# Patient Record
Sex: Male | Born: 1995 | Race: Black or African American | Hispanic: No | Marital: Single | Smoking: Never smoker
Health system: Southern US, Community
[De-identification: ages and names within clinical notes are randomized; demographics above are authoritative.]

## PROBLEM LIST (undated history)

## (undated) DIAGNOSIS — N289 Disorder of kidney and ureter, unspecified: Secondary | ICD-10-CM

## (undated) DIAGNOSIS — I10 Essential (primary) hypertension: Secondary | ICD-10-CM

## (undated) NOTE — *Deleted (*Deleted)
Pharmacy Student Note: For Learning Purposes ONLY. Not an active part of the chart.   Chief Complaint:  [id number] developed HA and blurry vision 11/23, then double vision  11/25. Seen by ophtalmologist who referred to ED due to papilledema. Pt had not taken BP meds for 2-3 weeks leading up to admit.   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   PSH:   Recommendations:  Renal plans to begin process for HD vasc placement, but no dialysis right now (no uremic sxs)  Clevidipine > 72HRs and TG elev (178) >> Titrate up orals to get off clevi drip (labetalol too)  PTH, VitD lvl ordered > f/u 11/30  Working up renin, aldo, catecholamines, metanephrines  Admit Complaint:  Anticoagulation: hep -pt mobile per crit care note  Infectious Disease  Cardiovascular: Hypertensive emergency sec to noncompliance; BP initially 175/133 (highest 256/172)  Hrs 100s (113) -elev troponins (type II demand) -Bp trend 11/28-11/29: 148/95 > 189/97 > 138/77 > BP goal now 140-150s per CCM MD (RN note) 11/30 BPS 155, 141 -current meds: hydralazine 100 mg PO Q8HR (max 300), amlodipine PO 10 mg, clevidipine IV infusion 0.5 mg/mL titrate w/ new goal of 140s-150s (previously MAP 112 w/ 25% reduction for first 24 hours) >> clevidipine drip x 72 hours >> dose continued to be titrated from 9>>11 yest   Electrolytes Hypokalemia 3.4 > monitor K Na 133 (possibly sec to free water retention d/t AKI) Met ac w/ bicarb decr > monitor (d/t AoCKD) CO2 19   Endocrinology TSH WNL Gastrointestinal / Nutrition  Neurology 11/27 CT Head - no acute findings; -head CT without findings 11/28 MRI - BP effects - diffuse abnormality in brainstem and cerebellum concerning for PRES   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

---

## 2017-04-21 ENCOUNTER — Emergency Department (HOSPITAL_COMMUNITY): Payer: 59

## 2017-04-21 ENCOUNTER — Observation Stay (HOSPITAL_COMMUNITY)
Admission: EM | Admit: 2017-04-21 | Discharge: 2017-04-22 | Disposition: A | Discharge status: Home / Self Care | Payer: 59 | Attending: Internal Medicine | Admitting: Internal Medicine

## 2017-04-21 ENCOUNTER — Encounter (HOSPITAL_COMMUNITY): Payer: Self-pay | Admitting: Emergency Medicine

## 2017-04-21 ENCOUNTER — Other Ambulatory Visit: Payer: Self-pay

## 2017-04-21 DIAGNOSIS — R9431 Abnormal electrocardiogram [ECG] [EKG]: Secondary | ICD-10-CM | POA: Diagnosis present

## 2017-04-21 DIAGNOSIS — I4581 Long QT syndrome: Secondary | ICD-10-CM | POA: Insufficient documentation

## 2017-04-21 DIAGNOSIS — I10 Essential (primary) hypertension: Secondary | ICD-10-CM | POA: Diagnosis present

## 2017-04-21 DIAGNOSIS — Z79899 Other long term (current) drug therapy: Secondary | ICD-10-CM | POA: Diagnosis not present

## 2017-04-21 DIAGNOSIS — Z9114 Patient's other noncompliance with medication regimen: Secondary | ICD-10-CM | POA: Diagnosis not present

## 2017-04-21 DIAGNOSIS — N189 Chronic kidney disease, unspecified: Secondary | ICD-10-CM | POA: Insufficient documentation

## 2017-04-21 DIAGNOSIS — N19 Unspecified kidney failure: Secondary | ICD-10-CM

## 2017-04-21 DIAGNOSIS — I169 Hypertensive crisis, unspecified: Principal | ICD-10-CM | POA: Diagnosis present

## 2017-04-21 DIAGNOSIS — I129 Hypertensive chronic kidney disease with stage 1 through stage 4 chronic kidney disease, or unspecified chronic kidney disease: Secondary | ICD-10-CM | POA: Insufficient documentation

## 2017-04-21 DIAGNOSIS — N289 Disorder of kidney and ureter, unspecified: Secondary | ICD-10-CM

## 2017-04-21 DIAGNOSIS — Z91018 Allergy to other foods: Secondary | ICD-10-CM | POA: Diagnosis not present

## 2017-04-21 LAB — I-STAT TROPONIN, ED: Troponin i, poc: 0.01 ng/mL (ref 0.00–0.08)

## 2017-04-21 LAB — CBC
HEMATOCRIT: 44.7 % (ref 39.0–52.0)
Hemoglobin: 15.6 g/dL (ref 13.0–17.0)
MCH: 31.8 pg (ref 26.0–34.0)
MCHC: 34.9 g/dL (ref 30.0–36.0)
MCV: 91.2 fL (ref 78.0–100.0)
Platelets: 295 10*3/uL (ref 150–400)
RBC: 4.9 MIL/uL (ref 4.22–5.81)
RDW: 12.6 % (ref 11.5–15.5)
WBC: 10.4 10*3/uL (ref 4.0–10.5)

## 2017-04-21 LAB — BASIC METABOLIC PANEL
Anion gap: 10 (ref 5–15)
BUN: 14 mg/dL (ref 6–20)
CHLORIDE: 101 mmol/L (ref 101–111)
CO2: 25 mmol/L (ref 22–32)
Calcium: 9.5 mg/dL (ref 8.9–10.3)
Creatinine, Ser: 3 mg/dL — ABNORMAL HIGH (ref 0.61–1.24)
GFR calc Af Amer: 33 mL/min — ABNORMAL LOW (ref >60)
GFR calc non Af Amer: 28 mL/min — ABNORMAL LOW (ref >60)
Glucose, Bld: 95 mg/dL (ref 65–99)
POTASSIUM: 3.9 mmol/L (ref 3.5–5.1)
SODIUM: 136 mmol/L (ref 135–145)

## 2017-04-21 MED ORDER — LABETALOL HCL 5 MG/ML IV SOLN
20.0000 mg | Freq: Once | INTRAVENOUS | Status: AC
  Administered 2017-04-21: 20 mg via INTRAVENOUS
  Filled 2017-04-21: qty 4

## 2017-04-21 NOTE — ED Provider Notes (Signed)
[facility] EMERGENCY DEPARTMENT Provider Note   CSN: [id number] Arrival date & time: 04/21/17  2044     History   Chief Complaint Chief Complaint  Patient presents with  . Hypertension  . Headache    HPI Robert Conner is a 22 y.o. male.  HPI  Robert Conner is a 22yo male with a history of hypertension who presents to the Emergency Department via urgent care for hypertension and headache.  Patient reports that he has had a mild headache for the past week, but it acutely worsened this morning.  Denies sudden onset/thunderclap headache or worst headache of his life.  He reports pain is bitemporal and feels "throbbing" in nature.  Reports that when he moves his head the pain travels to his posterior occiput.  He has not taken any over-the-counter medications for his symptoms.  Reports that he has had "trouble focusing," stating that he cannot work or complete ADLs due to the pain.  He states that he hasn't had much of an appetite today, but vomitied some water earlier. He denies trouble with his vision. Denies chest pain, shortness of breath, diaphoresis, lightheadedness, leg swelling, focal numbness or weakness, abdominal pain, diarrhea, dysuria, urinary frequency, difficulty urinating.   Patient states that two years ago he was seen by [facility] for "kidney problem." Is unsure of what he was diagnosed with at the time. States that he was hypertensive and had to be on lisinopril for a period of time but was told that he did not need to be on this anymore and has not taken it for about a year now.   History reviewed. No pertinent past medical history.  There are no active problems to display for this patient.   History reviewed. No pertinent surgical history.      Home Medications    Prior to Admission medications   Not on File    Family History No family history on file.  Social History Social History   Tobacco Use  . Smoking status: Never Smoker    . Smokeless tobacco: Never Used  Substance Use Topics  . Alcohol use: Yes    Comment: rarely  . Drug use: Never     Allergies   Other   Review of Systems Review of Systems  Constitutional: Negative for chills and fever.  HENT: Negative for trouble swallowing.   Respiratory: Negative for shortness of breath.   Cardiovascular: Negative for chest pain, palpitations and leg swelling.  Gastrointestinal: Positive for vomiting. Negative for abdominal pain.  Genitourinary: Negative for difficulty urinating, dysuria, flank pain and frequency.  Musculoskeletal: Negative for gait problem, neck pain and neck stiffness.  Neurological: Positive for headaches. Negative for speech difficulty, weakness and numbness.  Psychiatric/Behavioral: Negative for agitation and confusion.     Physical Exam Updated Vital Signs BP (!) 246/164   Pulse 89   Temp 99.1 F (37.3 C) (Oral)   Resp 18   Ht 5\' 8"  (1.727 m)   Wt 100.2 kg (221 lb)   SpO2 98%   BMI 33.60 kg/m   Physical Exam  Constitutional: He is oriented to person, place, and time. He appears well-developed and well-nourished. No distress.  Sitting at bedside in no apparent distress.   HENT:  Head: Normocephalic and atraumatic.  Mouth/Throat: Oropharynx is clear and moist. No oropharyngeal exudate.  Eyes: Pupils are equal, round, and reactive to light. Conjunctivae are normal. Right eye exhibits no discharge. Left eye exhibits no discharge.  Neck: Normal range  of motion. Neck supple. No JVD present. No tracheal deviation present.  Cardiovascular: Normal rate, regular rhythm and intact distal pulses. Exam reveals no friction rub.  No murmur heard. Pulmonary/Chest: Effort normal and breath sounds normal. No stridor. No respiratory distress. He has no wheezes.  Abdominal: Soft. Bowel sounds are normal. There is no tenderness. There is no guarding.  Musculoskeletal:  No leg swelling  Neurological: He is alert and oriented to person,  place, and time. Coordination normal.  Mental Status:  Alert, oriented, thought content appropriate, able to give a coherent history. Speech fluent without evidence of aphasia. Able to follow 2 step commands without difficulty.  Cranial Nerves:  II:  Peripheral visual fields grossly normal, pupils equal, round, reactive to light III,IV, VI: ptosis not present, extra-ocular motions intact bilaterally  V,VII: smile symmetric, facial light touch sensation equal VIII: hearing grossly normal to voice  X: uvula elevates symmetrically  XI: bilateral shoulder shrug symmetric and strong XII: midline tongue extension without fassiculations Motor:  Normal tone. 5/5 in upper and lower extremities bilaterally including strong and equal grip strength and dorsiflexion/plantar flexion Sensory: Pinprick and light touch normal in all extremities.  Cerebellar: normal finger-to-nose with bilateral upper extremities Gait: normal gait and balance  Skin: Skin is warm and dry. He is not diaphoretic.  Psychiatric: He has a normal mood and affect. His behavior is normal.  Nursing note and vitals reviewed.    ED Treatments / Results  Labs (all labs ordered are listed, but only abnormal results are displayed) Labs Reviewed  BASIC METABOLIC PANEL - Abnormal; Notable for the following components:      Result Value   Creatinine, Ser 3.00 (*)    GFR calc non Af Amer 28 (*)    GFR calc Af Amer 33 (*)    All other components within normal limits  CBC  URINALYSIS, ROUTINE W REFLEX MICROSCOPIC  I-STAT TROPONIN, ED    EKG EKG Interpretation  Date/Time:  Friday April 21 2017 21:15:33 EDT Ventricular Rate:  97 PR Interval:  146 QRS Duration: 82 QT Interval:  370 QTC Calculation: 469 R Axis:   51 Text Interpretation:  Normal sinus rhythm T wave abnormality, consider inferior ischemia Prolonged QT Abnormal ECG No old tracing to compare Confirmed by Duffy Bruce [phone]) on 04/21/2017 9:24:56  PM   Radiology Dg Chest 2 View  Result Date: 04/21/2017 CLINICAL DATA:  Hypertensive emergency with irregular EKG. Headache without current chest pain. EXAM: CHEST - 2 VIEW COMPARISON:  None. FINDINGS: The heart size and mediastinal contours are within normal limits. Both lungs are clear. The visualized skeletal structures are unremarkable. IMPRESSION: No active cardiopulmonary disease. Electronically Signed   By: Ashley Royalty M.D.   On: 04/21/2017 21:37   Ct Head Wo Contrast  Result Date: 04/21/2017 CLINICAL DATA:  Acute headache. EXAM: CT HEAD WITHOUT CONTRAST TECHNIQUE: Contiguous axial images were obtained from the base of the skull through the vertex without intravenous contrast. COMPARISON:  None. FINDINGS: Brain: No intracranial hemorrhage, mass effect, or midline shift. No hydrocephalus. The basilar cisterns are patent. No evidence of territorial infarct or acute ischemia. No extra-axial or intracranial fluid collection. Vascular: No hyperdense vessel or unexpected calcification. Skull: No fracture or focal lesion. Sinuses/Orbits: Small fluid level in right side of sphenoid sinus. Minimal ethmoid mucosal thickening. Mastoid air cells are clear. Visualized orbits are normal. Other: None. IMPRESSION: 1. Normal intracranial. 2. Small fluid level in right side of sphenoid sinus and mild mucosal thickening of the  ethmoid air cells. Electronically Signed   By: Jeb Levering M.D.   On: 04/21/2017 23:42    Procedures Procedures (including critical care time)  Medications Ordered in ED Medications  labetalol (NORMODYNE,TRANDATE) injection 20 mg (20 mg Intravenous Given 04/21/17 2342)     Initial Impression / Assessment and Plan / ED Course  I have reviewed the triage vital signs and the nursing notes.  Pertinent labs & imaging results that were available during my care of the patient were reviewed by me and considered in my medical decision making (see chart for details).     Patient  presents with hypertension and headache.  He reports nonspecific history of kidney disease in the past for which he was seen by [facility] pediatric team. Unfortunately no record of this in the EMR. Patient has been on lisinopril in the past but is not currently taking any antihypertensives.  On exam patient is afebrile and non-toxic. Blood pressure 232/166 on arrival. No neurological deficits. Lungs CTA. No peripheral swelling.   Labs reviewed. BMP reveals elevated Creatinine of 3.00, no previous to compare to. EKG reveals t-wave inversions in inferior leads, no prior EKG to compare. Istat troponin negative. CXR without acute abnormality, no pulmonary edema. CT head without acute abnormality, no ICH.   Do not suspect acute SAH given no history of sudden onset worst headache. No concern for meningitis given no nuchal rigidity or fever.  Attempted to get outside records from [place], but unsuccessful. Discussed this patient with Dr. Tyrone Nine. Given T-wave inversions in inferior leads on EKG and elevated creatinine plan to have patient admitted for hypertensive urgency. IV labetalol given in ED.    Discussed this patient with Hospitalist Dr. Myna Hidalgo who will admit patient. Family informed.   Final Clinical Impressions(s) / ED Diagnoses   Final diagnoses:  None    ED Discharge Orders    None       Glyn Ade, PA-C 04/22/17 [place], [place], DO 04/22/17 [phone]

## 2017-04-21 NOTE — ED Notes (Signed)
Patient transported to CT 

## 2017-04-21 NOTE — ED Triage Notes (Addendum)
Pt was seen at urgent care and sent here for hypertensive emergency and irregular EKG.  Pt complaint of headache and "unable to focus" but no chest pain at this time. States his physician had him on lisinopril for a time but that was discontinued by his physician almost 2 years ago.

## 2017-04-22 ENCOUNTER — Encounter (HOSPITAL_COMMUNITY): Payer: Self-pay | Admitting: Family Medicine

## 2017-04-22 ENCOUNTER — Observation Stay (HOSPITAL_COMMUNITY): Payer: 59

## 2017-04-22 DIAGNOSIS — I169 Hypertensive crisis, unspecified: Secondary | ICD-10-CM | POA: Diagnosis not present

## 2017-04-22 DIAGNOSIS — R9431 Abnormal electrocardiogram [ECG] [EKG]: Secondary | ICD-10-CM | POA: Diagnosis not present

## 2017-04-22 DIAGNOSIS — N289 Disorder of kidney and ureter, unspecified: Secondary | ICD-10-CM | POA: Diagnosis not present

## 2017-04-22 LAB — HIV ANTIBODY (ROUTINE TESTING W REFLEX): HIV Screen 4th Generation wRfx: NONREACTIVE

## 2017-04-22 LAB — COMPREHENSIVE METABOLIC PANEL
ALK PHOS: 71 U/L (ref 38–126)
ALT: 17 U/L (ref 17–63)
ANION GAP: 10 (ref 5–15)
AST: 20 U/L (ref 15–41)
Albumin: 3.5 g/dL (ref 3.5–5.0)
BUN: 16 mg/dL (ref 6–20)
CO2: 25 mmol/L (ref 22–32)
CREATININE: 2.97 mg/dL — AB (ref 0.61–1.24)
Calcium: 9.1 mg/dL (ref 8.9–10.3)
Chloride: 101 mmol/L (ref 101–111)
GFR calc non Af Amer: 29 mL/min — ABNORMAL LOW (ref >60)
GFR, EST AFRICAN AMERICAN: 33 mL/min — AB (ref >60)
Glucose, Bld: 116 mg/dL — ABNORMAL HIGH (ref 65–99)
Potassium: 3.8 mmol/L (ref 3.5–5.1)
SODIUM: 136 mmol/L (ref 135–145)
TOTAL PROTEIN: 6.7 g/dL (ref 6.5–8.1)
Total Bilirubin: 0.9 mg/dL (ref 0.3–1.2)

## 2017-04-22 LAB — CBC WITH DIFFERENTIAL/PLATELET
BASOS ABS: 0 10*3/uL (ref 0.0–0.1)
Basophils Relative: 0 %
EOS ABS: 0 10*3/uL (ref 0.0–0.7)
Eosinophils Relative: 0 %
HCT: 41.5 % (ref 39.0–52.0)
HEMOGLOBIN: 14.4 g/dL (ref 13.0–17.0)
Lymphocytes Relative: 20 %
Lymphs Abs: 2.1 10*3/uL (ref 0.7–4.0)
MCH: 31.4 pg (ref 26.0–34.0)
MCHC: 34.7 g/dL (ref 30.0–36.0)
MCV: 90.6 fL (ref 78.0–100.0)
MONO ABS: 0.4 10*3/uL (ref 0.1–1.0)
MONOS PCT: 4 %
NEUTROS ABS: 7.7 10*3/uL (ref 1.7–7.7)
NEUTROS PCT: 76 %
Platelets: 268 10*3/uL (ref 150–400)
RBC: 4.58 MIL/uL (ref 4.22–5.81)
RDW: 12.4 % (ref 11.5–15.5)
WBC: 10.2 10*3/uL (ref 4.0–10.5)

## 2017-04-22 LAB — RAPID URINE DRUG SCREEN, HOSP PERFORMED
Amphetamines: NOT DETECTED
Barbiturates: NOT DETECTED
Benzodiazepines: NOT DETECTED
Cocaine: NOT DETECTED
OPIATES: NOT DETECTED
Tetrahydrocannabinol: NOT DETECTED

## 2017-04-22 LAB — URINALYSIS, ROUTINE W REFLEX MICROSCOPIC
BILIRUBIN URINE: NEGATIVE
Glucose, UA: NEGATIVE mg/dL
KETONES UR: NEGATIVE mg/dL
Leukocytes, UA: NEGATIVE
NITRITE: NEGATIVE
Specific Gravity, Urine: 1.005 (ref 1.005–1.030)
Squamous Epithelial / LPF: NONE SEEN
pH: 6 (ref 5.0–8.0)

## 2017-04-22 LAB — CREATININE, URINE, RANDOM: CREATININE, URINE: 83.13 mg/dL

## 2017-04-22 LAB — SODIUM, URINE, RANDOM: SODIUM UR: 27 mmol/L

## 2017-04-22 MED ORDER — HEPARIN SODIUM (PORCINE) 5000 UNIT/ML IJ SOLN
5000.0000 [IU] | Freq: Three times a day (TID) | INTRAMUSCULAR | Status: DC
  Administered 2017-04-22: 5000 [IU] via SUBCUTANEOUS
  Filled 2017-04-22: qty 1

## 2017-04-22 MED ORDER — CLONIDINE HCL 0.2 MG PO TABS: 0.2000 mg | ORAL_TABLET | Freq: Three times a day (TID) | ORAL | 0 refills | Status: DC

## 2017-04-22 MED ORDER — SODIUM CHLORIDE 0.9% FLUSH
3.0000 mL | Freq: Two times a day (BID) | INTRAVENOUS | Status: DC
  Administered 2017-04-22: 3 mL via INTRAVENOUS

## 2017-04-22 MED ORDER — SENNOSIDES-DOCUSATE SODIUM 8.6-50 MG PO TABS: 1.0000 | ORAL_TABLET | Freq: Every evening | ORAL | Status: DC | PRN

## 2017-04-22 MED ORDER — ONDANSETRON HCL 4 MG PO TABS: 4.0000 mg | ORAL_TABLET | Freq: Four times a day (QID) | ORAL | Status: DC | PRN

## 2017-04-22 MED ORDER — ACETAMINOPHEN 650 MG RE SUPP: 650.0000 mg | Freq: Four times a day (QID) | RECTAL | Status: DC | PRN

## 2017-04-22 MED ORDER — ONDANSETRON HCL 4 MG/2ML IJ SOLN: 4.0000 mg | Freq: Four times a day (QID) | INTRAMUSCULAR | Status: DC | PRN

## 2017-04-22 MED ORDER — HYDROCODONE-ACETAMINOPHEN 5-325 MG PO TABS: 1.0000 | ORAL_TABLET | ORAL | Status: DC | PRN

## 2017-04-22 MED ORDER — BISACODYL 5 MG PO TBEC: 5.0000 mg | DELAYED_RELEASE_TABLET | Freq: Every day | ORAL | Status: DC | PRN

## 2017-04-22 MED ORDER — ACETAMINOPHEN 325 MG PO TABS: 650.0000 mg | ORAL_TABLET | Freq: Four times a day (QID) | ORAL | Status: DC | PRN

## 2017-04-22 MED ORDER — SODIUM CHLORIDE 0.9 % IV SOLN: 250.0000 mL | INTRAVENOUS | Status: DC | PRN

## 2017-04-22 MED ORDER — LABETALOL HCL 5 MG/ML IV SOLN
20.0000 mg | INTRAVENOUS | Status: DC | PRN
  Administered 2017-04-22: 20 mg via INTRAVENOUS
  Filled 2017-04-22: qty 4

## 2017-04-22 MED ORDER — CLONIDINE HCL 0.2 MG PO TABS
0.2000 mg | ORAL_TABLET | Freq: Three times a day (TID) | ORAL | Status: DC
  Administered 2017-04-22 (×3): 0.2 mg via ORAL
  Filled 2017-04-22 (×3): qty 1

## 2017-04-22 MED ORDER — SODIUM CHLORIDE 0.9% FLUSH: 3.0000 mL | INTRAVENOUS | Status: DC | PRN

## 2017-04-22 NOTE — ED Notes (Signed)
Declined W/C at D/C and was escorted to lobby by RN. 

## 2017-04-22 NOTE — ED Notes (Signed)
Two gram sodium lunch tray ordered

## 2017-04-22 NOTE — Discharge Instructions (Signed)

## 2017-04-22 NOTE — H&P (Signed)
History and Physical    Robert Conner VHQ:[id number] DOB: [birth year] DOA: 04/21/2017  PCP: Patient, No Pcp Per   Patient coming from: Home  Chief Complaint: Headaches   HPI: Robert Conner is a 22 y.o. male with medical history significant for hypertension and kidney disease, now presenting to the emergency department for evaluation of headaches.  Patient reports that he has been having mild headaches for the past week, went to urgent care for evaluation of this, was noted to have severe hypertension and EKG abnormalities, and was directed to the ED for further evaluation.  Patient reports that he had previously been on lisinopril for high blood pressure and was told that he has kidney disease.  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  He stopped following with his PCP more than a year ago and has not been on any medications.  He has not checked his blood pressure.  He denies chest pain, palpitations, edema, sweats, change in vision or hearing, or focal numbness or weakness.  ED Course: Upon arrival to the ED, patient is found to be afebrile, saturating well on room air, hypertensive to 246/164.  EKG features a sinus rhythm with inferior T wave inversions and chest x-ray is negative for acute cardiopulmonary disease.  Noncontrast head CT is negative for acute intracranial abnormality.  Chemistry panel is notable for a creatinine of 3.00 and CBC is unremarkable.  Troponin is normal.  Patient was given 20 mg IV labetalol in the ED.  Be admitted to the stepdown unit for ongoing evaluation and management of hypertensive crisis.  Review of Systems:  All other systems reviewed and apart from HPI, are negative.  History reviewed. No pertinent past medical history.  History reviewed. No pertinent surgical history.   reports that he has never smoked. He has never used smokeless tobacco. He reports that he drinks alcohol. He reports that he does not use drugs.  Allergies  Allergen  Reactions  . Other Swelling    Tree nuts    Family History  Problem Relation Age of Onset  . Prostate cancer Maternal Grandfather   . Hypertension Maternal Grandfather   . Hypertension Paternal Grandmother      Prior to Admission medications   Not on File    Physical Exam: Vitals:   04/21/17 2200 04/21/17 2230 04/22/17 0000 04/22/17 0030  BP: (!) 231/158 (!) 229/152 (!) 246/164 (!) 214/138  Pulse: 96 94 89 99  Resp:      Temp:      TempSrc:      SpO2: 99% 99% 98% 97%  Weight:      Height:          Constitutional: NAD, calm  Eyes: PERTLA, lids and conjunctivae normal ENMT: Mucous membranes are moist. Posterior pharynx clear of any exudate or lesions.   Neck: normal, supple, no masses, no thyromegaly Respiratory: clear to auscultation bilaterally, no wheezing, no crackles. Normal respiratory effort.    Cardiovascular: S1 & S2 heard, regular rate and rhythm. No significant JVD. Abdomen: No distension, no tenderness, soft. Bowel sounds normal.  Musculoskeletal: no clubbing / cyanosis. No joint deformity upper and lower extremities.    Skin: no significant rashes, lesions, ulcers. Warm, dry, well-perfused. Neurologic: CN 2-12 grossly intact. Sensation intact. Strength 5/5 in all 4 limbs.  Psychiatric: Alert and oriented x 3. Calm, cooperative.     Labs on Admission: I have personally reviewed following labs and imaging studies  CBC: Recent Labs  Lab 04/21/17 2123  WBC 10.4  HGB 15.6  HCT 44.7  MCV 91.2  PLT 818   Basic Metabolic Panel: Recent Labs  Lab 04/21/17 2123  NA 136  K 3.9  CL 101  CO2 25  GLUCOSE 95  BUN 14  CREATININE 3.00*  CALCIUM 9.5   GFR: Estimated Creatinine Clearance: 44.7 mL/min (A) (by C-G formula based on SCr of 3 mg/dL (H)). Liver Function Tests: No results for input(s): AST, ALT, ALKPHOS, BILITOT, PROT, ALBUMIN in the last 168 hours. No results for input(s): LIPASE, AMYLASE in the last 168 hours. No results for input(s):  AMMONIA in the last 168 hours. Coagulation Profile: No results for input(s): INR, PROTIME in the last 168 hours. Cardiac Enzymes: No results for input(s): CKTOTAL, CKMB, CKMBINDEX, TROPONINI in the last 168 hours. BNP (last 3 results) No results for input(s): PROBNP in the last 8760 hours. HbA1C: No results for input(s): HGBA1C in the last 72 hours. CBG: No results for input(s): GLUCAP in the last 168 hours. Lipid Profile: No results for input(s): CHOL, HDL, LDLCALC, TRIG, CHOLHDL, LDLDIRECT in the last 72 hours. Thyroid Function Tests: No results for input(s): TSH, T4TOTAL, FREET4, T3FREE, THYROIDAB in the last 72 hours. Anemia Panel: No results for input(s): VITAMINB12, FOLATE, FERRITIN, TIBC, IRON, RETICCTPCT in the last 72 hours. Urine analysis:    Component Value Date/Time   COLORURINE STRAW (A) 04/22/2017 0106   APPEARANCEUR CLEAR 04/22/2017 0106   LABSPEC 1.005 04/22/2017 0106   PHURINE 6.0 04/22/2017 0106   GLUCOSEU NEGATIVE 04/22/2017 0106   HGBUR SMALL (A) 04/22/2017 0106   BILIRUBINUR NEGATIVE 04/22/2017 0106   KETONESUR NEGATIVE 04/22/2017 0106   PROTEINUR >=300 (A) 04/22/2017 0106   NITRITE NEGATIVE 04/22/2017 0106   LEUKOCYTESUR NEGATIVE 04/22/2017 0106   Sepsis Labs: @LABRCNTIP (procalcitonin:4,lacticidven:4) )No results found for this or any previous visit (from the past 240 hour(s)).   Radiological Exams on Admission: Dg Chest 2 View  Result Date: 04/21/2017 CLINICAL DATA:  Hypertensive emergency with irregular EKG. Headache without current chest pain. EXAM: CHEST - 2 VIEW COMPARISON:  None. FINDINGS: The heart size and mediastinal contours are within normal limits. Both lungs are clear. The visualized skeletal structures are unremarkable. IMPRESSION: No active cardiopulmonary disease. Electronically Signed   By: Ashley Royalty M.D.   On: 04/21/2017 21:37   Ct Head Wo Contrast  Result Date: 04/21/2017 CLINICAL DATA:  Acute headache. EXAM: CT HEAD WITHOUT  CONTRAST TECHNIQUE: Contiguous axial images were obtained from the base of the skull through the vertex without intravenous contrast. COMPARISON:  None. FINDINGS: Brain: No intracranial hemorrhage, mass effect, or midline shift. No hydrocephalus. The basilar cisterns are patent. No evidence of territorial infarct or acute ischemia. No extra-axial or intracranial fluid collection. Vascular: No hyperdense vessel or unexpected calcification. Skull: No fracture or focal lesion. Sinuses/Orbits: Small fluid level in right side of sphenoid sinus. Minimal ethmoid mucosal thickening. Mastoid air cells are clear. Visualized orbits are normal. Other: None. IMPRESSION: 1. Normal intracranial. 2. Small fluid level in right side of sphenoid sinus and mild mucosal thickening of the ethmoid air cells. Electronically Signed   By: Jeb Levering M.D.   On: 04/21/2017 23:42    EKG: Independently reviewed. Sinus rhythm, inferior T-wave inversions.   Assessment/Plan  1. Hypertensive crisis  - Patient reports hx of HTN and CKD, previously treated with lisinopril but he has not taken in over a year and does not have a PCP  - He presents with one week of mild headaches, found to  have BP 246/164  - No electrolyte abnormalities  - No focal neurologic deficits and head CT is negative for intracranial abnormality  - No chest pain or SOB, troponin wnl, EKG abnormality discussed below  - Treated with labetalol IVP in ED  - Continue labetalol IVP's, start clonidine, check renal US and echo as below    2. 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 EKG abnormalities  - T-wave inversions noted in inferior  leads  - No chest pain, SOB, or troponin elevation  - No prior EKG available  - Likely secondary to #1, will check echo    DVT prophylaxis: sq heparin  Code Status: Full  Family Communication: Mother updated at bedside Consults called: None Admission status: Inpatient    Vianne Bulls, MD [facility] Pager [phone]  If 7PM-7AM, please contact night-coverage [email or web address] Password [email or web address]  04/22/2017, 1:24 AM

## 2017-04-22 NOTE — ED Triage Notes (Signed)
PT DC by [facility] . AVS printed and given to PT. Pt understands to pick up  RX at pharmacy.

## 2017-04-22 NOTE — Discharge Summary (Signed)
DISCHARGE SUMMARY  Robert Conner  MR#: [id number]  DOB:1995/11/19  Date of Admission: 04/21/2017 Date of Discharge: 04/22/2017  Attending Physician:Greely Atiyeh Hennie Duos, MD   Patient's TIR:[id number], No Pcp Per  Consults:  None   Disposition: D/C home   Follow-up Appts: Follow-up Information    [place]. Call in 2 day(s).   Contact information: [place] [phone]          Tests Needing Follow-up: -assess BP control  -consider transitioning to a QD BP med to improve compliance -f/u renal fxn   Discharge Diagnoses: Hypertensive crisis- Malignant HTN Acute on Chronic Kidney diseaseunknown stage  EKG abnormalities  Initial presentation: 22 y.o.malewith a hx of HTN and kidney disease who presented to the ED for evaluation of headaches. Patient had been having mild headaches for a week, went to and urgent care, was noted to have severe hypertension, and was directed to the ED for further evaluation. He stopped following with his PCP more than a year ago and had not been on any medications. He has not checked his blood pressure. He denies chest pain, palpitations, edema, sweats, change in vision or hearing, or focal numbness or weakness.  In the ED EKG featured a sinus rhythm with inferior T wave inversions and chest x-ray was negative for acute cardiopulmonary disease. Noncontrast head CT was negative for acute intracranial abnormality. Chemistry panel was notable for a creatinine of 3.00.  Hospital Course:  Hypertensive crisis has not taken BP meds in over a year and does not have a PCP- BP 246/164 at presentation - no focal neurologic deficits and head CT negative - BP much better controlled w/ meds in ED -I have spoken with the patient at length about the absolute necessity to control his blood pressure in the long-term as well as short-term consequences of failing to do so -he  voices understanding as to the absolute need for compliance with his blood pressure medications and close follow-up with a PCP to monitor ongoing control -though I do not feel clonidine is a great choice long-term it is currently controlling his blood pressure very well and I am hesitant to send him out on an otherwise unproven medication -I have counseled him that we will continue clonidine with 3 times daily dosing for now but did not follow-up he can discuss transitioning to a daily drug in a tapering fashion -at the time of his discharge his headache has resolved, he has no shortness of breath, he denies chest pain, and he is anxious to be allowed to go home  Kidney disease unknown stage  Cr was 3.00 on admission with no prior labs available- 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 [facility] trending down - renal US notes only medical renal disease - no indication for acute dialysis - I have counseled the patient on the absolute need for close monitoring of his kidney function and he assures me that he will establish himself with the PCP as of Monday (it is currently Saturday)  EKG abnormalities T-wave inversions noted in inferior leads - no chest pain, SOB, or troponin elevation -no prior EKG available- most likley due to long standing uncontrolled HTN - in absence of symptoms it is not felt that ongoing inpatient workup is indicated   Allergies as of 04/22/2017      Reactions   Other Swelling   Tree nuts      Medication List    TAKE  these medications   cloNIDine 0.2 MG tablet Commonly known as:  CATAPRES Take 1 tablet (0.2 mg total) by mouth every 8 (eight) hours.       Day of Discharge BP (!) 141/90   Pulse 71   Temp 99.1 F (37.3 C) (Oral)   Resp 17   Ht 5\' 8"  (1.727 m)   Wt 100.2 kg (221 lb)   SpO2 99%   BMI 33.60 kg/m   Physical Exam: General: No acute respiratory distress Lungs: Clear to auscultation bilaterally without wheezes or  crackles Cardiovascular: Regular rate and rhythm without murmur gallop or rub normal S1 and S2 Abdomen: Nontender, nondistended, soft, bowel sounds positive, no rebound, no ascites, no appreciable mass Extremities: No significant cyanosis, clubbing, or edema bilateral lower extremities  Basic Metabolic Panel: Recent Labs  Lab 04/21/17 2123 04/22/17 0448  NA 136 136  K 3.9 3.8  CL 101 101  CO2 25 25  GLUCOSE 95 116*  BUN 14 16  CREATININE 3.00* 2.97*  CALCIUM 9.5 9.1    Liver Function Tests: Recent Labs  Lab 04/22/17 0448  AST 20  ALT 17  ALKPHOS 71  BILITOT 0.9  PROT 6.7  ALBUMIN 3.5    CBC: Recent Labs  Lab 04/21/17 2123 04/22/17 0448  WBC 10.4 10.2  NEUTROABS  --  7.7  HGB 15.6 14.4  HCT 44.7 41.5  MCV 91.2 90.6  PLT 295 268    Time spent in discharge (includes decision making & examination of pt): 30 minutes  04/22/2017, 2:14 PM   Cherene Altes, MD [facility] Office  [phone] Pager [phone]  On-Call/Text Page:      [email or web address]      password [facility]

## 2017-04-22 NOTE — ED Notes (Signed)
Patient transported to Ultrasound 

## 2017-04-23 LAB — UREA NITROGEN, URINE: UREA NITROGEN UR: 208 mg/dL

## 2018-02-16 ENCOUNTER — Encounter: Payer: Self-pay | Admitting: Emergency Medicine

## 2018-02-16 ENCOUNTER — Ambulatory Visit
Admission: EM | Admit: 2018-02-16 | Discharge: 2018-02-16 | Disposition: A | Discharge status: Home / Self Care | Payer: 59 | Attending: Family Medicine | Admitting: Family Medicine

## 2018-02-16 ENCOUNTER — Ambulatory Visit: Payer: 59

## 2018-02-16 DIAGNOSIS — S43402A Unspecified sprain of left shoulder joint, initial encounter: Secondary | ICD-10-CM | POA: Diagnosis not present

## 2018-02-16 DIAGNOSIS — W19XXXA Unspecified fall, initial encounter: Secondary | ICD-10-CM

## 2018-02-16 DIAGNOSIS — Y9302 Activity, running: Secondary | ICD-10-CM

## 2018-02-16 MED ORDER — IBUPROFEN 800 MG PO TABS: 800.0000 mg | ORAL_TABLET | Freq: Three times a day (TID) | ORAL | 0 refills | Status: DC

## 2018-02-16 NOTE — ED Triage Notes (Signed)
Pt presents to [facility] for assessment of left shoulder pain.  Patient states he fell on it, felt a "pop" and thinks he dislocated it.  States he feels like he may have popped it back in to place.

## 2018-02-16 NOTE — Discharge Instructions (Signed)
NO fracture of dislocation Use anti-inflammatories for pain/swelling. You may take up to 800 mg Ibuprofen every 8 hours with food. You may supplement Ibuprofen with Tylenol [id number] mg every 8 hours.  Ice Gentle shoulder exercises  Follow up if symptoms not resolving

## 2018-02-16 NOTE — ED Provider Notes (Signed)
[facility]    CSN: [id number] Arrival date & time: 02/16/18  1341     History   Chief Complaint Chief Complaint  Patient presents with  . Shoulder Pain    HPI Robert Conner is a 23 y.o. male history of hypertension, kidney failure presenting today for evaluation of left shoulder injury.  Patient states that he was running into work earlier this morning trying to make it on time and he tripped and fell and landed on his left shoulder.  Believes he felt a pop.  Since he has had pain difficulty moving his shoulder.  Denies previous issues with this shoulder.  Denies numbness or tingling.  States that he can move his shoulder some, but it is very painful.  He has not taken anything for his pain.  HPI  History reviewed. No pertinent past medical history.  Patient Active Problem List   Diagnosis Date Noted  . Kidney disease 04/22/2017  . Hypertensive crisis 04/22/2017    History reviewed. No pertinent surgical history.     Home Medications    Prior to Admission medications   Medication Sig Start Date End Date Taking? Authorizing Provider  amLODipine (NORVASC) 10 MG tablet Take 10 mg by mouth daily.   Yes [provider]  hydrochlorothiazide (HYDRODIURIL) 25 MG tablet Take 12.5 mg by mouth daily.   Yes [provider]  lisinopril (PRINIVIL,ZESTRIL) 20 MG tablet Take 20 mg by mouth daily.   Yes [provider]  ibuprofen (ADVIL,MOTRIN) 800 MG tablet Take 1 tablet (800 mg total) by mouth 3 (three) times daily. 02/16/18   Wieters, Elesa Hacker, PA-C    Family History Family History  Problem Relation Age of Onset  . Prostate cancer Maternal Grandfather   . Hypertension Maternal Grandfather   . Hypertension Paternal Grandmother     Social History Social History   Tobacco Use  . Smoking status: Never Smoker  . Smokeless tobacco: Never Used  Substance Use Topics  . Alcohol use: Yes    Comment: rarely  . Drug use: Never     Allergies    Other   Review of Systems Review of Systems  Constitutional: Negative for fatigue and fever.  Eyes: Negative for redness, itching and visual disturbance.  Respiratory: Negative for shortness of breath.   Cardiovascular: Negative for chest pain and leg swelling.  Gastrointestinal: Negative for nausea and vomiting.  Musculoskeletal: Positive for arthralgias. Negative for myalgias.  Skin: Negative for color change, rash and wound.  Neurological: Negative for dizziness, syncope, weakness, light-headedness and headaches.     Physical Exam Triage Vital Signs ED Triage Vitals  Enc Vitals Group     BP --      Pulse Rate 02/16/18 1349 100     Resp 02/16/18 1349 20     Temp 02/16/18 1349 98.7 F (37.1 C)     Temp Source 02/16/18 1349 Oral     SpO2 02/16/18 1349 98 %     Weight --      Height --      Head Circumference --      Peak Flow --      Pain Score 02/16/18 1351 9     Pain Loc --      Pain Edu? --      Excl. in [place]? --    No data found.  Updated Vital Signs Pulse 100   Temp 98.7 F (37.1 C) (Oral)   Resp 20   SpO2 98%  Visual Acuity Right Eye Distance:   Left Eye Distance:   Bilateral Distance:    Right Eye Near:   Left Eye Near:    Bilateral Near:     Physical Exam Vitals signs and nursing note reviewed.  Constitutional:      Appearance: He is well-developed.     Comments: No acute distress  HENT:     Head: Normocephalic and atraumatic.     Nose: Nose normal.  Eyes:     Conjunctiva/sclera: Conjunctivae normal.  Neck:     Musculoskeletal: Neck supple.  Cardiovascular:     Rate and Rhythm: Normal rate.  Pulmonary:     Effort: Pulmonary effort is normal. No respiratory distress.  Abdominal:     General: There is no distension.  Musculoskeletal: Normal range of motion.     Comments: Left shoulder: Appears slightly swollen compared to right, tender to palpation of distal clavicle and AC joint, nontender along scapular spine, nontender throughout  trapezius, limited range of motion, special test difficult to test due to limited range of motion and pain.  Skin:    General: Skin is warm and dry.  Neurological:     Mental Status: He is alert and oriented to person, place, and time.      UC Treatments / Results  Labs (all labs ordered are listed, but only abnormal results are displayed) Labs Reviewed - No data to display  EKG None  Radiology Dg Shoulder Left  Result Date: 02/16/2018 CLINICAL DATA:  Left shoulder pain following a fall this morning. EXAM: LEFT SHOULDER - 2+ VIEW COMPARISON:  None. FINDINGS: There is no evidence of fracture or dislocation. There is no evidence of arthropathy or other focal bone abnormality. Soft tissues are unremarkable. IMPRESSION: Normal examination. Electronically Signed   By: Claudie Revering M.D.   On: 02/16/2018 14:13    Procedures Procedures (including critical care time)  Medications Ordered in UC Medications - No data to display  Initial Impression / Assessment and Plan / UC Course  I have reviewed the triage vital signs and the nursing notes.  Pertinent labs & imaging results that were available during my care of the patient were reviewed by me and considered in my medical decision making (see chart for details).     X-rays negative for fracture and dislocation, will treat as sprain with anti-inflammatories, initially prescribed ibuprofen, but then discussed with patient avoiding ibuprofen and NSAIDs given kidney history.  Will stick to Tylenol.  Patient verbalized understanding.  Will have follow-up for further evaluation if not regaining range of motion and strength for further imaging with MRI for possible rotator cuff injury.  Continue to monitor,Discussed strict return precautions. Patient verbalized understanding and is agreeable with plan.  Final Clinical Impressions(s) / UC Diagnoses   Final diagnoses:  Sprain of left shoulder, unspecified shoulder sprain type, initial encounter       Discharge Instructions     NO fracture of dislocation Use anti-inflammatories for pain/swelling. You may take up to 800 mg Ibuprofen every 8 hours with food. You may supplement Ibuprofen with Tylenol [phone] mg every 8 hours.  Ice Gentle shoulder exercises  Follow up if symptoms not resolving   ED Prescriptions    Medication Sig Dispense Auth. Provider   ibuprofen (ADVIL,MOTRIN) 800 MG tablet Take 1 tablet (800 mg total) by mouth 3 (three) times daily. 21 tablet Wieters, [place] C, PA-C     Controlled Substance Prescriptions [place] Controlled Substance Registry consulted? Not Applicable   Joneen Caraway,  Hallie C, PA-C 02/16/18 1432

## 2019-12-07 ENCOUNTER — Encounter (HOSPITAL_COMMUNITY): Payer: Self-pay | Admitting: Emergency Medicine

## 2019-12-07 ENCOUNTER — Other Ambulatory Visit: Payer: Self-pay

## 2019-12-07 ENCOUNTER — Emergency Department (HOSPITAL_COMMUNITY): Payer: 59

## 2019-12-07 ENCOUNTER — Inpatient Hospital Stay (HOSPITAL_COMMUNITY)
Admission: EM | Admit: 2019-12-07 | Discharge: 2019-12-14 | DRG: 280 | Disposition: A | Discharge status: Home / Self Care | Payer: 59 | Attending: Family Medicine | Admitting: Family Medicine

## 2019-12-07 DIAGNOSIS — Z8042 Family history of malignant neoplasm of prostate: Secondary | ICD-10-CM

## 2019-12-07 DIAGNOSIS — I674 Hypertensive encephalopathy: Secondary | ICD-10-CM | POA: Diagnosis present

## 2019-12-07 DIAGNOSIS — R519 Headache, unspecified: Secondary | ICD-10-CM | POA: Diagnosis not present

## 2019-12-07 DIAGNOSIS — I120 Hypertensive chronic kidney disease with stage 5 chronic kidney disease or end stage renal disease: Secondary | ICD-10-CM | POA: Diagnosis present

## 2019-12-07 DIAGNOSIS — Z20822 Contact with and (suspected) exposure to covid-19: Secondary | ICD-10-CM | POA: Diagnosis present

## 2019-12-07 DIAGNOSIS — H538 Other visual disturbances: Secondary | ICD-10-CM

## 2019-12-07 DIAGNOSIS — I21A1 Myocardial infarction type 2: Secondary | ICD-10-CM | POA: Diagnosis present

## 2019-12-07 DIAGNOSIS — D631 Anemia in chronic kidney disease: Secondary | ICD-10-CM | POA: Diagnosis present

## 2019-12-07 DIAGNOSIS — E871 Hypo-osmolality and hyponatremia: Secondary | ICD-10-CM | POA: Diagnosis not present

## 2019-12-07 DIAGNOSIS — Z8249 Family history of ischemic heart disease and other diseases of the circulatory system: Secondary | ICD-10-CM

## 2019-12-07 DIAGNOSIS — Z23 Encounter for immunization: Secondary | ICD-10-CM

## 2019-12-07 DIAGNOSIS — N179 Acute kidney failure, unspecified: Secondary | ICD-10-CM | POA: Diagnosis not present

## 2019-12-07 DIAGNOSIS — Z992 Dependence on renal dialysis: Secondary | ICD-10-CM

## 2019-12-07 DIAGNOSIS — H4710 Unspecified papilledema: Secondary | ICD-10-CM

## 2019-12-07 DIAGNOSIS — Z9114 Patient's other noncompliance with medication regimen: Secondary | ICD-10-CM

## 2019-12-07 DIAGNOSIS — E876 Hypokalemia: Secondary | ICD-10-CM | POA: Diagnosis not present

## 2019-12-07 DIAGNOSIS — E872 Acidosis: Secondary | ICD-10-CM | POA: Diagnosis not present

## 2019-12-07 DIAGNOSIS — I10 Essential (primary) hypertension: Secondary | ICD-10-CM

## 2019-12-07 DIAGNOSIS — R778 Other specified abnormalities of plasma proteins: Secondary | ICD-10-CM

## 2019-12-07 DIAGNOSIS — I161 Hypertensive emergency: Secondary | ICD-10-CM | POA: Diagnosis not present

## 2019-12-07 DIAGNOSIS — N186 End stage renal disease: Secondary | ICD-10-CM | POA: Diagnosis present

## 2019-12-07 DIAGNOSIS — Z419 Encounter for procedure for purposes other than remedying health state, unspecified: Secondary | ICD-10-CM

## 2019-12-07 HISTORY — DX: Essential (primary) hypertension: I10

## 2019-12-07 HISTORY — DX: Disorder of kidney and ureter, unspecified: N28.9

## 2019-12-07 LAB — RESP PANEL BY RT-PCR (FLU A&B, COVID) ARPGX2
Influenza A by PCR: NEGATIVE
Influenza B by PCR: NEGATIVE
SARS Coronavirus 2 by RT PCR: NEGATIVE

## 2019-12-07 LAB — URINALYSIS, ROUTINE W REFLEX MICROSCOPIC
Bilirubin Urine: NEGATIVE
Glucose, UA: NEGATIVE mg/dL
Ketones, ur: NEGATIVE mg/dL
Leukocytes,Ua: NEGATIVE
Nitrite: NEGATIVE
Protein, ur: >=300 mg/dL — AB
RBC / HPF: >50 RBC/hpf — ABNORMAL HIGH (ref 0–5)
Specific Gravity, Urine: 1.01 (ref 1.005–1.030)
pH: 6 (ref 5.0–8.0)

## 2019-12-07 LAB — CBC
HCT: 40.3 % (ref 39.0–52.0)
Hemoglobin: 14 g/dL (ref 13.0–17.0)
MCH: 31.3 pg (ref 26.0–34.0)
MCHC: 34.7 g/dL (ref 30.0–36.0)
MCV: 90.2 fL (ref 80.0–100.0)
Platelets: 171 10*3/uL (ref 150–400)
RBC: 4.47 MIL/uL (ref 4.22–5.81)
RDW: 13 % (ref 11.5–15.5)
WBC: 13.2 10*3/uL — ABNORMAL HIGH (ref 4.0–10.5)
nRBC: 0 % (ref 0.0–0.2)

## 2019-12-07 LAB — BASIC METABOLIC PANEL
Anion gap: 16 — ABNORMAL HIGH (ref 5–15)
BUN: 61 mg/dL — ABNORMAL HIGH (ref 6–20)
CO2: 21 mmol/L — ABNORMAL LOW (ref 22–32)
Calcium: 9.5 mg/dL (ref 8.9–10.3)
Chloride: 98 mmol/L (ref 98–111)
Creatinine, Ser: 10.1 mg/dL — ABNORMAL HIGH (ref 0.61–1.24)
GFR, Estimated: 7 mL/min — ABNORMAL LOW (ref >60)
Glucose, Bld: 94 mg/dL (ref 70–99)
Potassium: 4.6 mmol/L (ref 3.5–5.1)
Sodium: 135 mmol/L (ref 135–145)

## 2019-12-07 LAB — TROPONIN I (HIGH SENSITIVITY)
Troponin I (High Sensitivity): 157 ng/L (ref <18)
Troponin I (High Sensitivity): 154 ng/L (ref <18)

## 2019-12-07 LAB — DIFFERENTIAL
Abs Immature Granulocytes: 0.07 10*3/uL (ref 0.00–0.07)
Basophils Absolute: 0.1 10*3/uL (ref 0.0–0.1)
Basophils Relative: 0 %
Eosinophils Absolute: 0 10*3/uL (ref 0.0–0.5)
Eosinophils Relative: 0 %
Immature Granulocytes: 1 %
Lymphocytes Relative: 15 %
Lymphs Abs: 2 10*3/uL (ref 0.7–4.0)
Monocytes Absolute: 0.8 10*3/uL (ref 0.1–1.0)
Monocytes Relative: 6 %
Neutro Abs: 10.7 10*3/uL — ABNORMAL HIGH (ref 1.7–7.7)
Neutrophils Relative %: 78 %

## 2019-12-07 LAB — CBG MONITORING, ED: Glucose-Capillary: 123 mg/dL — ABNORMAL HIGH (ref 70–99)

## 2019-12-07 MED ORDER — AMLODIPINE BESYLATE 5 MG PO TABS: 10.0000 mg | ORAL_TABLET | Freq: Once | ORAL | Status: DC

## 2019-12-07 MED ORDER — HYDRALAZINE HCL 20 MG/ML IJ SOLN
10.0000 mg | Freq: Once | INTRAMUSCULAR | Status: AC
  Administered 2019-12-07: 16:00:00 10 mg via INTRAVENOUS
  Filled 2019-12-07: qty 1

## 2019-12-07 MED ORDER — ONDANSETRON HCL 4 MG/2ML IJ SOLN: 4.0000 mg | Freq: Four times a day (QID) | INTRAMUSCULAR | Status: DC | PRN

## 2019-12-07 MED ORDER — CLEVIDIPINE BUTYRATE 0.5 MG/ML IV EMUL
0.0000 mg/h | INTRAVENOUS | Status: DC
  Administered 2019-12-07: 1 mg/h via INTRAVENOUS
  Administered 2019-12-08: 2.5 mg/h via INTRAVENOUS
  Administered 2019-12-08 (×2): 4 mg/h via INTRAVENOUS
  Administered 2019-12-08: 2.5 mg/h via INTRAVENOUS
  Administered 2019-12-08: 5 mg/h via INTRAVENOUS
  Administered 2019-12-08: 6 mg/h via INTRAVENOUS
  Administered 2019-12-09: 9 mg/h via INTRAVENOUS
  Administered 2019-12-09: 4 mg/h via INTRAVENOUS
  Administered 2019-12-09: 5 mg/h via INTRAVENOUS
  Administered 2019-12-09: 11 mg/h via INTRAVENOUS
  Administered 2019-12-10: 13 mg/h via INTRAVENOUS
  Administered 2019-12-10: 14 mg/h via INTRAVENOUS
  Administered 2019-12-10: 8 mg/h via INTRAVENOUS
  Administered 2019-12-10: 10 mg/h via INTRAVENOUS
  Administered 2019-12-10: 17 mg/h via INTRAVENOUS
  Administered 2019-12-11: 4 mg/h via INTRAVENOUS
  Administered 2019-12-11: 3 mg/h via INTRAVENOUS
  Filled 2019-12-07: qty 100
  Filled 2019-12-07: qty 50
  Filled 2019-12-07: qty 100
  Filled 2019-12-07 (×3): qty 50
  Filled 2019-12-07 (×2): qty 100
  Filled 2019-12-07: qty 50
  Filled 2019-12-07 (×2): qty 100
  Filled 2019-12-07: qty 50
  Filled 2019-12-07: qty 100
  Filled 2019-12-07: qty 50
  Filled 2019-12-07 (×2): qty 100
  Filled 2019-12-07: qty 50
  Filled 2019-12-07: qty 100
  Filled 2019-12-07: qty 50
  Filled 2019-12-07: qty 100

## 2019-12-07 MED ORDER — ACETAMINOPHEN 650 MG RE SUPP: 650.0000 mg | Freq: Four times a day (QID) | RECTAL | Status: DC | PRN

## 2019-12-07 MED ORDER — HYDRALAZINE HCL 50 MG PO TABS
50.0000 mg | ORAL_TABLET | Freq: Three times a day (TID) | ORAL | Status: DC
  Administered 2019-12-07 – 2019-12-08 (×2): 50 mg via ORAL
  Filled 2019-12-07: qty 1
  Filled 2019-12-07: qty 2

## 2019-12-07 MED ORDER — SODIUM CHLORIDE 0.9 % IV BOLUS
1000.0000 mL | Freq: Once | INTRAVENOUS | Status: AC
  Administered 2019-12-07: 1000 mL via INTRAVENOUS

## 2019-12-07 MED ORDER — HYDROCHLOROTHIAZIDE 25 MG PO TABS: 12.5000 mg | ORAL_TABLET | Freq: Every day | ORAL | Status: DC

## 2019-12-07 MED ORDER — AMMONIA AROMATIC IN INHA
RESPIRATORY_TRACT | Status: AC
  Filled 2019-12-07: qty 10

## 2019-12-07 MED ORDER — HYDROCHLOROTHIAZIDE 12.5 MG PO CAPS: 12.5000 mg | ORAL_CAPSULE | Freq: Once | ORAL | Status: DC

## 2019-12-07 MED ORDER — AMLODIPINE BESYLATE 10 MG PO TABS: 10.0000 mg | ORAL_TABLET | Freq: Every day | ORAL | Status: DC

## 2019-12-07 MED ORDER — ACETAMINOPHEN 325 MG PO TABS
650.0000 mg | ORAL_TABLET | Freq: Four times a day (QID) | ORAL | Status: DC | PRN
  Administered 2019-12-08: 650 mg via ORAL
  Filled 2019-12-07: qty 2

## 2019-12-07 MED ORDER — HEPARIN SODIUM (PORCINE) 5000 UNIT/ML IJ SOLN
5000.0000 [IU] | Freq: Three times a day (TID) | INTRAMUSCULAR | Status: DC
  Administered 2019-12-07 – 2019-12-14 (×17): 5000 [IU] via SUBCUTANEOUS
  Filled 2019-12-07 (×17): qty 1

## 2019-12-07 MED ORDER — LISINOPRIL 20 MG PO TABS
20.0000 mg | ORAL_TABLET | Freq: Once | ORAL | Status: AC
  Administered 2019-12-07: 16:00:00 20 mg via ORAL
  Filled 2019-12-07: qty 1

## 2019-12-07 MED ORDER — ONDANSETRON HCL 4 MG PO TABS: 4.0000 mg | ORAL_TABLET | Freq: Four times a day (QID) | ORAL | Status: DC | PRN

## 2019-12-07 NOTE — ED Notes (Signed)
MD at bedside. 

## 2019-12-07 NOTE — H&P (Signed)
NAME:  Robert Conner, MRN:  [id number], DOB:  10-12-1995, LOS: 0 ADMISSION DATE:  12/07/2019, CONSULTATION DATE:  12/08/2019 REFERRING MD:  Rupert Stacks, CHIEF COMPLAINT:  Hypertensive emergency   Brief History   [name] is a 24 yo man with a PMHx of HTN who presented to [facility] ED on 12/07/19 due to headache and blurry vision found to have hypertensive emergency. He had been referred to the ED from the ophthalmology office where he was found to have papilledema in both eyes.   History of present illness   [name] is a 24 yo man with a PMHx of HTN who presented to [facility] ED on 12/07/19 due to headache and blurry vision found to have hypertensive emergency. He had been referred to the ED from the ophthalmology office where he was found to have papilledema in both eyes.  Mr. Stene had a 4 day history of headache and then developed blurry vision which prompted him to seek ophthalmology evaluation. He has a history of high blood pressure at baseline but has not been taking his blood pressure medications. (Per ED note, on amlodipine 10 mg daily, Lisinopril 20 mg, HCTZ 12.5 daily)  In the ED his blood pressure was initially 175/133 with highest recorded blood pressure of 256/172. He was started on a clevidipine drip. He had a head CT which was without acute findings. His creatinine was found to be elevated to 10 (baseline from 2019 of 3).  Past Medical History  HTN, not compliant with medications  Significant Hospital Events   Admitted 11/27 for hypertensive emergency  Consults:   Nephrology consult in the am  Procedures:  Plan for arterial line placement  Significant Diagnostic Tests:   CT head 12/07/19: No acute intracranial findings  Micro Data:  NA  Antimicrobials:  NA  Interim history/subjective:  As per HPI  Objective   Blood pressure (!) 190/123, pulse (!) 117, temperature 98.5 F (36.9 C), temperature source Oral, resp. rate (!) 24, height 5\' 6"  (1.676 m),  weight 96.2 kg, SpO2 100 %.        Intake/Output Summary (Last 24 hours) at 12/07/2019 2307 Last data filed at 12/07/2019 2117 Gross per 24 hour  Intake 13.42 ml  Output --  Net 13.42 ml   Filed Weights   12/07/19 1438  Weight: 96.2 kg    Examination: General: Awake, alert and cooperative HENT: Mucus membranes moist, dysconjugate gaze Lungs: Clear to auscultation bilaterally, without wheezes, moving air well Cardiovascular: Tachycardic, regular rhythm Abdomen: Soft, nontender, non-distented Extremities: Lower extremities without edema Neuro: Alert, maintains appropriate conversation, strength 5/5 and sensation in tact bilaterally in upper and lower extremities as well as face    Resolved Hospital Problem list   NA  Assessment & Plan:  [name] is a 24 yo man with a PMHx of HTN who presented to [facility] ED on 12/07/19 due to headache and blurry vision found to have hypertensive emergency. He had been referred to the ED from the ophthalmology office where he was found to have papilledema in both eyes.  #Cardiac: Hypertensive Emergency -Status post hydralazine 10mg  IV once, lisinopril 20mg  once in the ED -Continue clevidipine gtt started in the ED -Hydralazine 50mg  po q8 hours -Nifedipine 30mg  daily -Urine tox screen -Will place an arterial line for close blood pressure monitoring  NSTEMI: Likely type II in setting of hypertensive emergency -High sensitivity troponin peaked at 157, then down to 154 -No ECG findings suggestive of ischemia  #Neuro:  Papilledema and Headache in the setting of hypertensive emergency: -CT head without findings -Follow up MR head imaging  #Renal: AKI: No acute indication for dialysis -Treat hypertensive emergency as above -Nephrology consult in the am -Avoid nephrotoxic agents -Renal ultrasound  #Pulmonary: No active issues  #GI: No active issues  #Heme: No active issues  #ID: No active issues  #Endocrine: No active  issues  #GU: No active issues   Best practice (evaluated daily)   Diet: Regular Pain/Anxiety/Delirium protocol (if indicated): NA VAP protocol (if indicated): NA DVT prophylaxis: Patient mobile, no indication for pharmacologic prophylaxis GI prophylaxis: NA Glucose control: Will monitor sugars Mobility: Out of bed as tolerated last date of multidisciplinary goals of care discussion: NA Family and staff present: NA Summary of discussion: NA Follow up goals of care discussion due: NA Code Status: Full Disposition: ICU  Labs   CBC: Recent Labs  Lab 12/07/19 1528  WBC 13.2*  NEUTROABS 10.7*  HGB 14.0  HCT 40.3  MCV 90.2  PLT 323    Basic Metabolic Panel: Recent Labs  Lab 12/07/19 1528  NA 135  K 4.6  CL 98  CO2 21*  GLUCOSE 94  BUN 61*  CREATININE 10.10*  CALCIUM 9.5   GFR: Estimated Creatinine Clearance: 12.4 mL/min (A) (by C-G formula based on SCr of 10.1 mg/dL (H)). Recent Labs  Lab 12/07/19 1528  WBC 13.2*    Liver Function Tests: No results for input(s): AST, ALT, ALKPHOS, BILITOT, PROT, ALBUMIN in the last 168 hours. No results for input(s): LIPASE, AMYLASE in the last 168 hours. No results for input(s): AMMONIA in the last 168 hours.  ABG No results found for: PHART, PCO2ART, PO2ART, HCO3, TCO2, ACIDBASEDEF, O2SAT   Coagulation Profile: No results for input(s): INR, PROTIME in the last 168 hours.  Cardiac Enzymes: No results for input(s): CKTOTAL, CKMB, CKMBINDEX, TROPONINI in the last 168 hours.  HbA1C: No results found for: HGBA1C  CBG: Recent Labs  Lab 12/07/19 1710  GLUCAP 123*    Review of Systems:   Negative except as per HPI  Past Medical History  He,  has a past medical history of Hypertension and Renal disorder.   Surgical History   History reviewed. No pertinent surgical history.   Social History   reports that he has never smoked. He has never used smokeless tobacco. He reports current alcohol use. He reports that  he does not use drugs.   Family History   His family history includes Hypertension in his maternal grandfather and paternal grandmother; Prostate cancer in his maternal grandfather.   Allergies Allergies  Allergen Reactions  . Other Swelling    Tree nuts     Home Medications  Prior to Admission medications   Medication Sig Start Date End Date Taking? Authorizing Provider  allopurinol (ZYLOPRIM) 300 MG tablet Take 300 mg by mouth daily.   Yes [provider]  hydrochlorothiazide (HYDRODIURIL) 25 MG tablet Take 12.5 mg by mouth daily.   Yes [provider]  lisinopril (PRINIVIL,ZESTRIL) 20 MG tablet Take 20 mg by mouth daily.   Yes [provider]     Critical care time: 60 minutes

## 2019-12-07 NOTE — ED Triage Notes (Signed)
Patient reports he has had headache since Tuesday. Started having blurry vision . Patient went to eye doctor., eye doctor sent paper saying there is swelling in eyes. Patient has HTN. Patient reports non compliance with blood pressure medications. Educated patient on blood pressure and importance of taking medications. Patient denies pain right now, says he has minor headache. Patient reports neck stiffness also.

## 2019-12-07 NOTE — ED Notes (Signed)
Dr Trifan at bedside  

## 2019-12-07 NOTE — ED Notes (Signed)
Hospitalist at bedside 

## 2019-12-07 NOTE — H&P (Addendum)
Medicine consult  Robert Conner:[id number] DOB: 1995/05/05 DOA: 12/07/2019  PCP: Patient, No Pcp Per  Outpatient Specialists: [facility] Patient coming from: Home  I have personally briefly reviewed patient's old medical records in [place].  Chief Concern: Hypertension  HPI: Robert Conner is a 24 y.o. male with medical history significant for hypertension on hctz 12.5 mg and lisinopril 20 mg, CKD, who presents with chief concern of blurry to his ophthalmologist.  He developed headache and blurry vision on Tuesday and then double vision on Thursday. He went to his ophthalmologist and was told he needed to present to the emergency department due to papilledema.  He hasn't been taking his high blood pressure medication for 2-3 weeks.    ED Course: Discussed with ED provider, requesting admission for hypertensive emergency  Review of Systems: As per HPI otherwise 10 point review of systems negative.  Assessment/Plan  Principal Problem:   Hypertensive emergency Active Problems:   Papilledema   AKI (acute kidney injury) ([place])   Hypertensive emergency-secondary to noncompliance -Status post hydralazine 10 mg IV once, lisinopril 20 mg once in the emergency department -Clevidipine gtt started in the ED and this will be continued -Hydralazine 50 mg p.o. every 8 hours including now -Amlodipine 10 mg -Recommend PCCM for admission to ICU -Checking thyroid  Papilledema -  -CT head w/o contrast was negative for acute intracranial findings by CT -MRI of the brain with contrast contraindicated due to acute kidney injury, GFR less than 7 -Discussed with radiologist on call and recommends: MRI of the head without contrast, MRA of the head without contrast, MR V of the head without contrast to assess for lesion, this obstruction of dural sinuses, hydrocephalus -Frequent neurochecks  AKI -  -Baseline Cr in 2020 was 3.97 in 06/07/2018 -AM team to consult nephrology   -Possible need for hemodialysis, transfer to [facility]  Chart reviewed.   DVT prophylaxis: Heparin Code Status: full code  Diet: Cardiac Family Communication: Discussed with girlfriend at bedside, Disposition Plan: Pending clinical course Consults called: PCCM Admission status: PCCM  Past Medical History:  Diagnosis Date  . Hypertension   . Renal disorder    History reviewed. No pertinent surgical history.  Social History:  reports that he has never smoked. He has never used smokeless tobacco. He reports current alcohol use. He reports that he does not use drugs.  Allergies  Allergen Reactions  . Other Swelling    Tree nuts   Family History  Problem Relation Age of Onset  . Prostate cancer Maternal Grandfather   . Hypertension Maternal Grandfather   . Hypertension Paternal Grandmother    Family history: Family history reviewed and not pertinent  Prior to Admission medications   Medication Sig Start Date End Date Taking? Authorizing Provider  allopurinol (ZYLOPRIM) 300 MG tablet Take 300 mg by mouth daily.   Yes [provider]  hydrochlorothiazide (HYDRODIURIL) 25 MG tablet Take 12.5 mg by mouth daily.   Yes [provider]  lisinopril (PRINIVIL,ZESTRIL) 20 MG tablet Take 20 mg by mouth daily.   Yes [provider]   Physical Exam: Vitals:   12/08/19 0045 12/08/19 0100 12/08/19 0115 12/08/19 0119  BP: (!) 172/107 (!) 177/108 (!) 177/108   Pulse: (!) 107 (!) 113 (!) 116   Resp: (!) 25 20 (!) 24   Temp:    98.8 F (37.1 C)  TempSrc:    Oral  SpO2: 99% 99% 98%   Weight:  Height:       Constitutional: appears age-appropriate, NAD, calm, comfortable Eyes: PERRL, lids and conjunctivae normal ENMT: Mucous membranes are moist. Posterior pharynx clear of any exudate or lesions. Age-appropriate dentition. Hearing appropriate Neck: normal, supple, no masses, no thyromegaly Respiratory: clear to auscultation bilaterally, no wheezing, no  crackles. Normal respiratory effort. No accessory muscle use.  Cardiovascular: Regular rate and rhythm, no murmurs / rubs / gallops. No extremity edema. 2+ pedal pulses. No carotid bruits.  Abdomen: no tenderness, no masses palpated, no hepatosplenomegaly. Bowel sounds positive.  Musculoskeletal: no clubbing / cyanosis. No joint deformity upper and lower extremities. Good ROM, no contractures, no atrophy. Normal muscle tone.  Skin: no rashes, lesions, ulcers. No induration Neurologic: Sensation intact. Strength 5/5 in all 4.  Psychiatric: Normal judgment and insight. Alert and oriented x 3. Normal mood.   EKG: Independently reviewed, showing sinus tachycardia with rate of 103, QTc 473, LVH  Chest x-ray on Admission: Personally reviewed and I agree with radiologist reading as below.  CT Head Wo Contrast  Result Date: 12/07/2019 CLINICAL DATA:  Headache, blurred vision, papilledema, hypertension EXAM: CT HEAD WITHOUT CONTRAST TECHNIQUE: Contiguous axial images were obtained from the base of the skull through the vertex without intravenous contrast. COMPARISON:  04/21/2017 FINDINGS: Brain: No evidence of acute infarction, hemorrhage, hydrocephalus, extra-axial collection or mass lesion/mass effect. Vascular: No hyperdense vessel or unexpected calcification. Skull: Normal. Negative for fracture or focal lesion. Sinuses/Orbits: Paranasal sinuses and mastoid air cells are clear. Orbits and optic nerves appear within normal limits. Other: None. IMPRESSION: No acute intracranial findings by CT. Electronically Signed   By: Davina Poke D.O.   On: 12/07/2019 16:25   Labs on Admission: I have personally reviewed following labs  CBC: Recent Labs  Lab 12/07/19 1528  WBC 13.2*  NEUTROABS 10.7*  HGB 14.0  HCT 40.3  MCV 90.2  PLT 282   Basic Metabolic Panel: Recent Labs  Lab 12/07/19 1528  NA 135  K 4.6  CL 98  CO2 21*  GLUCOSE 94  BUN 61*  CREATININE 10.10*  CALCIUM 9.5   CBG: Recent  Labs  Lab 12/07/19 1710 12/08/19 0151  GLUCAP 123* 114*   Urine analysis:    Component Value Date/Time   COLORURINE YELLOW 12/07/2019 [place] 12/07/2019 1539   LABSPEC 1.010 12/07/2019 1539   PHURINE 6.0 12/07/2019 1539   GLUCOSEU NEGATIVE 12/07/2019 1539   HGBUR MODERATE (A) 12/07/2019 1539   BILIRUBINUR NEGATIVE 12/07/2019 [place] 12/07/2019 1539   PROTEINUR >=300 (A) 12/07/2019 1539   NITRITE NEGATIVE 12/07/2019 1539   LEUKOCYTESUR NEGATIVE 12/07/2019 1539   Salam Micucci N Akesha Uresti D.O. [facility]  If 2AM-7AM, please contact overnight-coverage provider If 7AM-7PM, please contact day coverage provider [email or web address]  12/08/2019, 1:54 AM

## 2019-12-07 NOTE — ED Notes (Signed)
Spoke to patient's significant other, [place], with patient's permission and gave her an update on patient's status.

## 2019-12-07 NOTE — ED Provider Notes (Signed)
Robert Conner Provider Note   CSN: [id number] Arrival date & time: 12/07/19  1416     History Chief Complaint  Patient presents with  . Diplopia  . Headache    Robert Conner is a 24 y.o. male w/ hx of "kidney disease," HTN (on amlodipine 10 mg daily, Lisinopril 20 mg, HCTZ 12.5 daily - but does NOT take it regularly), presented to emergency department with headache and blurred vision.  Patient presents in the presence of his girlfriend.  They report that he was having a posterior headache and some episodes of blurred vision a few days ago.  Today he went to the ophthalmology office, we had his pupils dilated and full ocular exam.  He was referred to the emergency department and told that he has "swelling in the eyes".  Per bedside ophthalmologist note, the patient was noted to have papilledema in both eyes.  He continues having some double vision.  He denies any pain in his eyes.  He reports he has a very mild pulsating headache in the back of his neck and head.  He says it feels like a "migraine".  He denies ever having blurred vision in the past.  He reports that he noted today that his blood pressure was significantly higher than normal.  However he does not have a blood pressure cuff at home, and had erroneously been using his apple watch, thinking that his heart rate was in fact his blood pressure.  He does not know how long has been having high blood pressure.  He does not take his blood pressure medications that he is prescribed.  He reports he takes it maybe once a week, but his girlfriend says he "hardly ever takes them".  He denies any significant family history of hypertension.  He denies any active chest pain or difficulty urinating.  HPI     Past Medical History:  Diagnosis Date  . Hypertension   . Renal disorder     Patient Active Problem List   Diagnosis Date Noted  . Hypertensive emergency 12/07/2019  . Kidney disease 04/22/2017   . Hypertensive crisis 04/22/2017    History reviewed. No pertinent surgical history.     Family History  Problem Relation Age of Onset  . Prostate cancer Maternal Grandfather   . Hypertension Maternal Grandfather   . Hypertension Paternal Grandmother     Social History   Tobacco Use  . Smoking status: Never Smoker  . Smokeless tobacco: Never Used  Vaping Use  . Vaping Use: Never used  Substance Use Topics  . Alcohol use: Yes    Comment: rarely  . Drug use: Never    Home Medications Prior to Admission medications   Medication Sig Start Date End Date Taking? Authorizing Provider  allopurinol (ZYLOPRIM) 300 MG tablet Take 300 mg by mouth daily.   Yes [provider]  hydrochlorothiazide (HYDRODIURIL) 25 MG tablet Take 12.5 mg by mouth daily.   Yes [provider]  lisinopril (PRINIVIL,ZESTRIL) 20 MG tablet Take 20 mg by mouth daily.   Yes [provider]    Allergies    Other  Review of Systems   Review of Systems  Constitutional: Negative for chills and fever.  HENT: Negative for ear pain and sore throat.   Eyes: Positive for visual disturbance. Negative for photophobia, pain and redness.  Respiratory: Negative for cough and stridor.   Cardiovascular: Negative for chest pain and palpitations.  Gastrointestinal: Negative for abdominal pain and vomiting.  Genitourinary: Negative for dysuria and hematuria.  Musculoskeletal: Negative for arthralgias and myalgias.  Skin: Negative for color change and rash.  Neurological: Positive for headaches. Negative for syncope and light-headedness.  Psychiatric/Behavioral: Negative for agitation and confusion.  All other systems reviewed and are negative.   Physical Exam Updated Vital Signs BP (!) 207/124   Pulse (!) 125   Temp 98.5 F (36.9 C) (Oral)   Resp 14   Ht 5\' 6"  (1.676 m)   Wt 96.2 kg   SpO2 100%   BMI 34.22 kg/m   Physical Exam Vitals and nursing note reviewed.  Constitutional:       Appearance: He is well-developed.  HENT:     Head: Normocephalic and atraumatic.  Eyes:     Extraocular Movements: Extraocular movements intact.     Conjunctiva/sclera: Conjunctivae normal.  Cardiovascular:     Rate and Rhythm: Normal rate and regular rhythm.     Heart sounds: Normal heart sounds.  Pulmonary:     Effort: Pulmonary effort is normal. No respiratory distress.     Breath sounds: Normal breath sounds.  Abdominal:     Palpations: Abdomen is soft.     Tenderness: There is no abdominal tenderness.  Musculoskeletal:     Cervical back: Neck supple.  Skin:    General: Skin is warm and dry.  Neurological:     Mental Status: He is alert.     GCS: GCS eye subscore is 4. GCS verbal subscore is 5. GCS motor subscore is 6.  Psychiatric:        Mood and Affect: Mood normal.        Behavior: Behavior normal.     ED Results / Procedures / Treatments   Labs (all labs ordered are listed, but only abnormal results are displayed) Labs Reviewed  BASIC METABOLIC PANEL - Abnormal; Notable for the following components:      Result Value   CO2 21 (*)    BUN 61 (*)    Creatinine, Ser 10.10 (*)    GFR, Estimated 7 (*)    Anion gap 16 (*)    All other components within normal limits  CBC - Abnormal; Notable for the following components:   WBC 13.2 (*)    All other components within normal limits  URINALYSIS, ROUTINE W REFLEX MICROSCOPIC - Abnormal; Notable for the following components:   Hgb urine dipstick MODERATE (*)    Protein, ur >=300 (*)    RBC / HPF >50 (*)    Bacteria, UA RARE (*)    All other components within normal limits  DIFFERENTIAL - Abnormal; Notable for the following components:   Neutro Abs 10.7 (*)    All other components within normal limits  CBG MONITORING, ED - Abnormal; Notable for the following components:   Glucose-Capillary 123 (*)    All other components within normal limits  TROPONIN I (HIGH SENSITIVITY) - Abnormal; Notable for the following  components:   Troponin I (High Sensitivity) 157 (*)    All other components within normal limits  TROPONIN I (HIGH SENSITIVITY) - Abnormal; Notable for the following components:   Troponin I (High Sensitivity) 154 (*)    All other components within normal limits  RESP PANEL BY RT-PCR (FLU A&B, COVID) ARPGX2  HIV ANTIBODY (ROUTINE TESTING W REFLEX)  TSH  BASIC METABOLIC PANEL  CBC    EKG EKG Interpretation  Date/Time:  Saturday December 07 2019 15:43:04 EST Ventricular Rate:  103 PR Interval:  QRS Duration: 83 QT Interval:  361 QTC Calculation: 473 R Axis:   34 Text Interpretation: Sinus tachycardia Borderline T abnormalities, diffuse leads Borderline ST elevation, anterior leads Borderline prolonged QT interval No STEMI Confirmed by Octaviano Glow [phone]) on 12/07/2019 3:49:30 PM   Radiology CT Head Wo Contrast  Result Date: 12/07/2019 CLINICAL DATA:  Headache, blurred vision, papilledema, hypertension EXAM: CT HEAD WITHOUT CONTRAST TECHNIQUE: Contiguous axial images were obtained from the base of the skull through the vertex without intravenous contrast. COMPARISON:  04/21/2017 FINDINGS: Brain: No evidence of acute infarction, hemorrhage, hydrocephalus, extra-axial collection or mass lesion/mass effect. Vascular: No hyperdense vessel or unexpected calcification. Skull: Normal. Negative for fracture or focal lesion. Sinuses/Orbits: Paranasal sinuses and mastoid air cells are clear. Orbits and optic nerves appear within normal limits. Other: None. IMPRESSION: No acute intracranial findings by CT. Electronically Signed   By: Davina Poke D.O.   On: 12/07/2019 16:25    Procedures .Critical Care Performed by: Wyvonnia Dusky, MD Authorized by: Wyvonnia Dusky, MD   Critical care provider statement:    Critical care time (minutes):  45   Critical care was time spent personally by me on the following activities:  Discussions with consultants, evaluation of patient's  response to treatment, examination of patient, ordering and performing treatments and interventions, ordering and review of laboratory studies, ordering and review of radiographic studies, pulse oximetry, re-evaluation of patient's condition, obtaining history from patient or surrogate and review of old charts Comments:     Hypertensive emergency, IV medications for BP   (including critical care time)  Medications Ordered in ED Medications  clevidipine (CLEVIPREX) infusion 0.5 mg/mL (2 mg/hr Intravenous Rate/Dose Change 12/07/19 2118)  ammonia inhalant (  Not Given 12/07/19 1744)  heparin injection 5,000 Units (has no administration in time range)  acetaminophen (TYLENOL) tablet 650 mg (has no administration in time range)    Or  acetaminophen (TYLENOL) suppository 650 mg (has no administration in time range)  ondansetron (ZOFRAN) tablet 4 mg (has no administration in time range)    Or  ondansetron (ZOFRAN) injection 4 mg (has no administration in time range)  hydrALAZINE (APRESOLINE) tablet 50 mg (has no administration in time range)  amLODipine (NORVASC) tablet 10 mg (has no administration in time range)  hydrALAZINE (APRESOLINE) injection 10 mg (10 mg Intravenous Given 12/07/19 1545)  lisinopril (ZESTRIL) tablet 20 mg (20 mg Oral Given 12/07/19 1545)  sodium chloride 0.9 % bolus 1,000 mL (0 mLs Intravenous Stopped 12/07/19 1926)  sodium chloride 0.9 % bolus 1,000 mL (0 mLs Intravenous Stopped 12/07/19 2207)    ED Course  I have reviewed the triage vital signs and the nursing notes.  Pertinent labs & imaging results that were available during my care of the patient were reviewed by me and considered in my medical decision making (see chart for details).  This is a 24 year old male present emergency department concern for hypertensive emergency.  He has elevated blood pressure was noted to have papilledema and diplopia on the ophthalmologist office today.  He also has a posterior  headache.  There is no confusion or encephalopathy.  He has no fever.  I have a low suspicion for meningitis.  He has never had any kind of neuroimaging.  Will need a CT scan here to evaluate for bleed or tumor.  I have also ordered labs including a cardiac and kidney evaluation for malignant hypertension.  IV hydralazine ordered, PO lisinopril meds.  If no significant changes he will  need to be on an infusion for BP lowering.  *  Labs reviewed - trop elevated but flat.  No ischemia on ECG.  Doubt ACS.   Covid/flu negative UA with hgb, protein, RBC CR 10 today.  Concern for kidney disease - he continues to make urine, at this point no emergent indication for dialysis  CTH reviewed -no acute pathology noted  IV clevidipine infusion ordered for BP control after BP continued to elevate with initial medications.   Clinical Course as of Dec 07 2206  Sat Dec 07, 2019  1549 All home BP meds ordered   [MT]  1553 BP now 976 systolic and persistent on recheck, IV clevidipine ordered, nurse informed   [MT]  1718 I was called into the room and notified that the patient had become diaphoretic and unresponsive.  This was shortly after titration updates of his clevipdine infusion, with BP having dropped to 734 systolic.  We halted the clevidipine and placed the patient in trendelenburg. Within 2 minutes he was able to speak to me and follow commands.  He denies CP but reports lightheadedness and headache.  I ordered a fluid bolus and have instructed the nurse for new titration parameters to maintain SBP between 180-220 mmhg.     [MT]  1719 I suspect his baseline BP may have been higher than on presentation (175/133) and so his MAP reduction was too dramatic.   [MT]  1735 SBP 170 mmhg now, patient awake and setting up, conversing, appears to be back to baseline mental state.     [MT]  1937 SBP 200 now, he has no headache, still drowsy but otherwise good mental status.   [MT]  9024 Discussed with  ICU team who advised consulting hospitalist, pt should be okay for stepdown, may need [facility] transfer for kidney disease - but not anuric at this time or needing dialysis.  I confirmed with pharmacy that clevidipine is OK to infuse with this kidney dysfunction - no renal adjustement needed   [MT]  1846 [place] trop flat   [MT]  1950 On reassessment, pt has no headache now, mental state okay. Upon clarification he denies any vigorous exercise or workouts or trauma recently, or myalgias, to be consistent with rhabdo.  I explained his diagnosis to him and the need for hospitalization. I will re-page the hospitalist for admission.   [MT]    Clinical Course User Index [MT] Solomon Skowronek, Carola Rhine, MD      Final Clinical Impression(s) / ED Diagnoses Final diagnoses:  Hypertensive emergency  AKI (acute kidney injury) ([place])  Elevated troponin  Blurred vision    Rx / DC Orders ED Discharge Orders    None       Wyvonnia Dusky, MD 12/07/19 2209

## 2019-12-07 NOTE — ED Notes (Signed)
Pt still reports blurry vision.

## 2019-12-07 NOTE — Progress Notes (Signed)
Case reviewed with EDP. Here with hypertensive emergency. CT looks okay. Has papilledema per optho Would do usual CCB gtt in stepdown or ICU, whichever allows. Goal 25% lowering MAP in 24h His renal function is also a bit concerning, would have nephro eval for possible secondary causes.  Please reach out if any questions or concerns.  Erskine Emery MD PCCM

## 2019-12-07 NOTE — ED Notes (Signed)
Date and time results received: 12/07/19 5:04 PM  (use smartphrase ".now" to insert current time)  Test: troponin Critical Value: 157  Name of Provider Notified: Trifan  Orders Received? Or Actions Taken?: notified MD Trifan

## 2019-12-07 NOTE — ED Notes (Signed)
Pt transported to CT ?

## 2019-12-07 NOTE — ED Notes (Signed)
Pt became very diaphoretic and unresponsive shortly after BP dropped significantly. MD made aware.

## 2019-12-08 ENCOUNTER — Inpatient Hospital Stay (HOSPITAL_COMMUNITY): Payer: 59

## 2019-12-08 ENCOUNTER — Encounter (HOSPITAL_COMMUNITY): Payer: 59

## 2019-12-08 DIAGNOSIS — E876 Hypokalemia: Secondary | ICD-10-CM | POA: Diagnosis not present

## 2019-12-08 DIAGNOSIS — E871 Hypo-osmolality and hyponatremia: Secondary | ICD-10-CM | POA: Diagnosis not present

## 2019-12-08 DIAGNOSIS — I120 Hypertensive chronic kidney disease with stage 5 chronic kidney disease or end stage renal disease: Secondary | ICD-10-CM | POA: Diagnosis present

## 2019-12-08 DIAGNOSIS — I161 Hypertensive emergency: Secondary | ICD-10-CM | POA: Diagnosis present

## 2019-12-08 DIAGNOSIS — I21A1 Myocardial infarction type 2: Secondary | ICD-10-CM | POA: Diagnosis present

## 2019-12-08 DIAGNOSIS — N179 Acute kidney failure, unspecified: Secondary | ICD-10-CM | POA: Diagnosis present

## 2019-12-08 DIAGNOSIS — I160 Hypertensive urgency: Secondary | ICD-10-CM | POA: Diagnosis not present

## 2019-12-08 DIAGNOSIS — Z9114 Patient's other noncompliance with medication regimen: Secondary | ICD-10-CM | POA: Diagnosis not present

## 2019-12-08 DIAGNOSIS — I674 Hypertensive encephalopathy: Secondary | ICD-10-CM | POA: Diagnosis present

## 2019-12-08 DIAGNOSIS — R519 Headache, unspecified: Secondary | ICD-10-CM | POA: Diagnosis present

## 2019-12-08 DIAGNOSIS — Z23 Encounter for immunization: Secondary | ICD-10-CM | POA: Diagnosis not present

## 2019-12-08 DIAGNOSIS — Z8249 Family history of ischemic heart disease and other diseases of the circulatory system: Secondary | ICD-10-CM | POA: Diagnosis not present

## 2019-12-08 DIAGNOSIS — H4710 Unspecified papilledema: Secondary | ICD-10-CM | POA: Diagnosis present

## 2019-12-08 DIAGNOSIS — E872 Acidosis: Secondary | ICD-10-CM | POA: Diagnosis not present

## 2019-12-08 DIAGNOSIS — N185 Chronic kidney disease, stage 5: Secondary | ICD-10-CM | POA: Diagnosis not present

## 2019-12-08 DIAGNOSIS — N186 End stage renal disease: Secondary | ICD-10-CM | POA: Diagnosis present

## 2019-12-08 DIAGNOSIS — Z20822 Contact with and (suspected) exposure to covid-19: Secondary | ICD-10-CM | POA: Diagnosis present

## 2019-12-08 DIAGNOSIS — D631 Anemia in chronic kidney disease: Secondary | ICD-10-CM | POA: Diagnosis present

## 2019-12-08 DIAGNOSIS — Z8042 Family history of malignant neoplasm of prostate: Secondary | ICD-10-CM | POA: Diagnosis not present

## 2019-12-08 LAB — PROTEIN / CREATININE RATIO, URINE
Creatinine, Urine: 93.27 mg/dL
Protein Creatinine Ratio: 3.24 mg/mg{Cre} — ABNORMAL HIGH (ref 0.00–0.15)
Total Protein, Urine: 302 mg/dL

## 2019-12-08 LAB — CBC
HCT: 33.8 % — ABNORMAL LOW (ref 39.0–52.0)
Hemoglobin: 11.7 g/dL — ABNORMAL LOW (ref 13.0–17.0)
MCH: 31.1 pg (ref 26.0–34.0)
MCHC: 34.6 g/dL (ref 30.0–36.0)
MCV: 89.9 fL (ref 80.0–100.0)
Platelets: 173 10*3/uL (ref 150–400)
RBC: 3.76 MIL/uL — ABNORMAL LOW (ref 4.22–5.81)
RDW: 13.2 % (ref 11.5–15.5)
WBC: 13.8 10*3/uL — ABNORMAL HIGH (ref 4.0–10.5)
nRBC: 0 % (ref 0.0–0.2)

## 2019-12-08 LAB — BASIC METABOLIC PANEL
Anion gap: 13 (ref 5–15)
BUN: 57 mg/dL — ABNORMAL HIGH (ref 6–20)
CO2: 20 mmol/L — ABNORMAL LOW (ref 22–32)
Calcium: 8.7 mg/dL — ABNORMAL LOW (ref 8.9–10.3)
Chloride: 100 mmol/L (ref 98–111)
Creatinine, Ser: 10.08 mg/dL — ABNORMAL HIGH (ref 0.61–1.24)
GFR, Estimated: 7 mL/min — ABNORMAL LOW (ref >60)
Glucose, Bld: 101 mg/dL — ABNORMAL HIGH (ref 70–99)
Potassium: 3.4 mmol/L — ABNORMAL LOW (ref 3.5–5.1)
Sodium: 133 mmol/L — ABNORMAL LOW (ref 135–145)

## 2019-12-08 LAB — MRSA PCR SCREENING: MRSA by PCR: NEGATIVE

## 2019-12-08 LAB — RAPID URINE DRUG SCREEN, HOSP PERFORMED
Amphetamines: NOT DETECTED
Barbiturates: NOT DETECTED
Benzodiazepines: NOT DETECTED
Cocaine: NOT DETECTED
Opiates: NOT DETECTED
Tetrahydrocannabinol: NOT DETECTED

## 2019-12-08 LAB — GLUCOSE, CAPILLARY: Glucose-Capillary: 114 mg/dL — ABNORMAL HIGH (ref 70–99)

## 2019-12-08 MED ORDER — NIFEDIPINE ER OSMOTIC RELEASE 30 MG PO TB24
30.0000 mg | ORAL_TABLET | Freq: Every day | ORAL | Status: DC
  Filled 2019-12-08: qty 1

## 2019-12-08 MED ORDER — HYDRALAZINE HCL 50 MG PO TABS
100.0000 mg | ORAL_TABLET | Freq: Three times a day (TID) | ORAL | Status: DC
  Administered 2019-12-08 – 2019-12-14 (×19): 100 mg via ORAL
  Filled 2019-12-08 (×19): qty 2

## 2019-12-08 MED ORDER — AMLODIPINE BESYLATE 10 MG PO TABS
10.0000 mg | ORAL_TABLET | Freq: Every day | ORAL | Status: DC
  Administered 2019-12-08 – 2019-12-14 (×6): 10 mg via ORAL
  Filled 2019-12-08 (×6): qty 1

## 2019-12-08 MED ORDER — LIDOCAINE HCL 1 % IJ SOLN
5.0000 mL | Freq: Once | INTRAMUSCULAR | Status: AC
  Administered 2019-12-08: 5 mL via INTRADERMAL
  Filled 2019-12-08 (×2): qty 5

## 2019-12-08 MED ORDER — CHLORHEXIDINE GLUCONATE CLOTH 2 % EX PADS
6.0000 | MEDICATED_PAD | Freq: Every day | CUTANEOUS | Status: DC
  Administered 2019-12-08 – 2019-12-12 (×6): 6 via TOPICAL

## 2019-12-08 MED ORDER — SODIUM CHLORIDE 0.9 % IV SOLN: INTRAVENOUS | Status: DC | PRN

## 2019-12-08 NOTE — ED Notes (Signed)
Pt states for the past few nights he has been tachycardiac with his HR in the 120's-130's per data from his watch.

## 2019-12-08 NOTE — Progress Notes (Signed)
NAME:  Polk Minor, MRN:  [id number], DOB:  11/01/1995, LOS: 0 ADMISSION DATE:  12/07/2019, CONSULTATION DATE:  12/08/2019 REFERRING MD:  Rupert Stacks, CHIEF COMPLAINT:  Hypertensive emergency   Brief History   Mr. Jaylenn is a 24 yo man with a PMHx of HTN who presented to [facility] ED on 12/07/19 due to headache and blurry vision found to have hypertensive emergency. He had been referred to the ED from the ophthalmology office where he was found to have papilledema in both eyes.   History of present illness   Mr. Bentlee is a 24 yo man with a PMHx of HTN who presented to [facility] ED on 12/07/19 due to headache and blurry vision found to have hypertensive emergency. He had been referred to the ED from the ophthalmology office where he was found to have papilledema in both eyes.  Mr. Wolf had a 4 day history of headache and then developed blurry vision which prompted him to seek ophthalmology evaluation. He has a history of high blood pressure at baseline but has not been taking his blood pressure medications. (Per ED note, on amlodipine 10 mg daily, Lisinopril 20 mg, HCTZ 12.5 daily)  In the ED his blood pressure was initially 175/133 with highest recorded blood pressure of 256/172. He was started on a clevidipine drip. He had a head CT which was without acute findings. His creatinine was found to be elevated to 10 (baseline from 2019 of 3).  Past Medical History  HTN, not compliant with medications  Significant Hospital Events   Admitted 11/27 for hypertensive emergency  Consults:   Nephrology consult in the am  Procedures:  Plan for arterial line placement  Significant Diagnostic Tests:   CT head 12/07/19: No acute intracranial findings  Micro Data:  NA  Antimicrobials:  NA  Interim history/subjective:  No events, still has blurry vision. Denies chest pain or SOB. Making urine.  Objective   Blood pressure (!) 159/79, pulse 100, temperature 98.7 F (37.1 C),  temperature source Oral, resp. rate (!) 22, height 5\' 6"  (1.676 m), weight 96.2 kg, SpO2 98 %.    FiO2 (%):  [21 %] 21 %   Intake/Output Summary (Last 24 hours) at 12/08/2019 0630 Last data filed at 12/08/2019 0800 Gross per 24 hour  Intake 60.35 ml  Output 1050 ml  Net -989.65 ml   Filed Weights   12/07/19 1438  Weight: 96.2 kg    Examination: Constitutional: young man in NAD  Eyes: EOMI, pupils equal Ears, nose, mouth, and throat: MMM, trachea midline Cardiovascular: RRR, ext warm Respiratory: clear bilaterally, no accessory muscle use Gastrointestinal: soft, +BS Skin: No rashes, normal turgor Neurologic: Moves all 4 ext to command, good  strength Psychiatric: RASS -1  Creatinine still 10, BUN 55 arguing for chronic process CBC stable  Resolved Hospital Problem list   NA  Assessment & Plan:  Hypertensive Emergency- with increased ICP on fundal exam by ophthalmology as well as on MRI.  Degree of hypertension is remarkable and warrants workup for secondary causes.  CKD5- approaching need for HD, do not think this is an acute injury.  US kidneys in 2019 showed marked kidney injury.  Still making urine.  - Consult to nephrology to help workup secondary causes of hypertension and approaching end stage renal disease - Check utox - I am not really sure the utility in consulting neurology as his MRI findings are c/w severe hypertension, would monitor vision clinically as Bps trend to normal and  repeat fundus exam in a few days - Continue cleviprex titrated to MAP 112 (25% reduction) for first 24h (until 8:30PM tonight) then work toward normotension  [employer] (evaluated daily)   Diet: heart healthy Pain/Anxiety/Delirium protocol (if indicated): NA VAP protocol (if indicated): NA DVT prophylaxis: Patient mobile, no indication for pharmacologic prophylaxis GI prophylaxis: NA Glucose control: Will monitor sugars Mobility: Out of bed as tolerated last date of  multidisciplinary goals of care discussion: NA Family and staff present: NA Summary of discussion: NA Follow up goals of care discussion due: NA Code Status: Full Disposition: ICU   Patient critically ill due to hypertensive emergency manifested as increased ICP, blurry vision, papilledema Interventions to address this today close BP monitoring and titration of  Risk of deterioration without these interventions is high  I personally spent 34 minutes providing critical care not including any separately billable procedures  Erskine Emery MD [facility] 12/08/2019 9:24 AM Personal pager: [phone] If unanswered, please page CCM On-call: [phone]

## 2019-12-08 NOTE — Procedures (Signed)
Arterial Catheter Insertion Procedure Note  Delray Reza  [id number]  1995-12-06  Date:12/08/19  Time:6:45 AM    Provider Performing: Jorje Guild    Procedure: Insertion of Arterial Line [phone]) with US guidance ([id number])   Indication(s) Blood pressure monitoring and/or need for frequent ABGs  Consent Risks of the procedure as well as the alternatives and risks of each were explained to the patient and/or caregiver.  Consent for the procedure was obtained and is signed in the bedside chart  Anesthesia Local with 1% lidocaine   Time Out Verified patient identification, verified procedure, site/side was marked, verified correct patient position, special equipment/implants available, medications/allergies/relevant history reviewed, required imaging and test results available.   Sterile Technique Maximal sterile technique including full sterile barrier drape, hand hygiene, sterile gown, sterile gloves, mask, hair covering, sterile ultrasound probe cover (if used).   Procedure Description Area of catheter insertion was cleaned with chlorhexidine and draped in sterile fashion. With real-time ultrasound guidance an arterial catheter was placed into the left radial artery.  Appropriate arterial tracings confirmed on monitor.     Complications/Tolerance None; patient tolerated the procedure well.   EBL Minimal   Specimen(s) None

## 2019-12-08 NOTE — Progress Notes (Signed)
Multiple attempts made to start radial A-lines.  Attempts were unsuccessful.  RN notified.

## 2019-12-08 NOTE — Consult Note (Signed)
Nephrology Consult   Requesting provider: Erskine Emery Service requesting consult: CCM Reason for consult: AKI on CKD and Resistant HTN   Assessment/Recommendations: Robert Conner is a/an 24 y.o. 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  Creatinine last checked in May of 2020 was 4. 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  -Avoid nephrotoxic medications including NSAIDs and Vanc/Zosyn combo -Renal PVL as below, UPC -Hold further lisinopril for now  Hypertensive emergency/chronic resistant hypertension/PRES: Unclear history.  No strong family history.  Will need work-up for secondary causes -Goal 25% reduction in blood pressure daily -Continue clevidipine drip -Continue amlodipine 10 mg daily and hydralazine 100 mg 3 times daily -We will continue titrating therapy slowly -Hold further lisinopril -No more IV fluids, received 2 L yesterday -Renal artery PVL -Renin and Aldo -Urine drug screen  Hypokalemia: Potassium slightly low today at 3.4.  Continue to monitor  Hyponatremia: Sodium 133 likely secondary to free water retention in the setting of AKI  Elevated troponin: Likely demand.  Continue to monitor  Metabolic acidosis: Mild decrease in bicarb 20.  Likely related to AKI/CKD.  Continue to monitor   Recommendations conveyed to primary service.    [facility] 12/08/2019 9:20 AM   _____________________________________________________________________________________ CC: HTN, AKI  History of Present Illness: Robert Conner is a/an 24 y.o. male with a past medical history of HTN and CKD who presents with severe HTN and blurred vision.  Patient had minimal input today when I interviewed him. Some history was obtained per chart review.  Patient presented yesterday after being told he had papilledema by ophthalmology.  He had seen them for ongoing headache and blurry vision.  The patient has had posterior headache that of been ongoing for for several days as well as double vision.  In the emergency department he was noted to be extremely hypertensive.  The patient states that he takes lisinopril 20 mg and hydrochlorothiazide 12.5 mg daily but does not take these medications regularly.  He states that his nephrologist in [facility] prescribes his medicines but he does not know the last time he saw them.  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  He does not remember details.  Ultrasound in 2019 demonstrated relatively small kidneys (9.6 cm and 8.4 cm).  He does not know how long he has been off his medication and he does not know the cause of his kidney disease.  He does not have a strong family history of hypertension.  In the ER his urinalysis demonstrated extensive proteinuria and hematuria as well as pyuria.  Given for creatinine of 10.1 and an elevated troponin of 157.  MRI of the brain and orbits demonstrated diffuse T2 flair signal abnormality throughout the brainstem and cerebellum concerning for PRES. he also had papilledema and cerebral edema.  The patient states he feels better today.  He is urinating without significant issues.  He said he got labs a couple weeks ago at [facility] urgent care in [facility] but does not know the results.  The last creatinine I have is from  May and 2020 when his creatinine was 4.  Medications:  Current Facility-Administered Medications  Medication Dose Route Frequency Provider Last Rate Last Admin  . 0.9 %  sodium chloride infusion   Intra-arterial PRN Jorje Guild, MD      . acetaminophen (TYLENOL) tablet 650 mg  650 mg Oral Q6H PRN Jorje Guild, MD       Or  . acetaminophen (TYLENOL) suppository 650 mg  650 mg Rectal Q6H PRN Jorje Guild, MD      . amLODipine (NORVASC) tablet 10 mg  10 mg Oral Daily Candee Furbish, MD      . Chlorhexidine Gluconate Cloth 2 % PADS 6 each  6 each Topical Daily Kovacich, Estill Bamberg, MD      . clevidipine (CLEVIPREX) infusion 0.5 mg/mL  0-21 mg/hr Intravenous Continuous Jorje Guild, MD 7 mL/hr at 12/08/19 0800 3.5 mg/hr at 12/08/19 0800  . heparin injection 5,000 Units  5,000 Units Subcutaneous Q8H Jorje Guild, MD   5,000 Units at 12/08/19 7322  . hydrALAZINE (APRESOLINE) tablet 100 mg  100 mg Oral Q8H Candee Furbish, MD      . ondansetron [facility]) tablet 4 mg  4 mg Oral Q6H PRN Jorje Guild, MD       Or  . ondansetron (ZOFRAN) injection 4 mg  4 mg Intravenous Q6H PRN Jorje Guild, MD         ALLERGIES Other  MEDICAL HISTORY Past Medical History:  Diagnosis Date  . Hypertension   . Renal disorder      SOCIAL HISTORY Social History   Socioeconomic History  . Marital status: Single    Spouse name: Not on file  . Number of children: Not on file  . Years of education: Not on file  . Highest education level: Not on file  Occupational History  . Not on file  Tobacco Use  . Smoking status: Never Smoker  . Smokeless tobacco: Never Used  Vaping Use  . Vaping Use: Never used  Substance and Sexual Activity  . Alcohol use: Yes    Comment: rarely  . Drug use: Never  . Sexual activity: Not on file  Other Topics Concern  . Not on file  Social History Narrative  . Not on file   Social Determinants of Health   Financial Resource Strain:   . Difficulty  of Paying Living Expenses: Not on file  Food Insecurity:   . Worried About Charity fundraiser in the Last Year: Not on file  . Ran Out of Food in the Last Year: Not on file  Transportation Needs:   . Lack of Transportation (Medical): Not on file  . Lack of Transportation (Non-Medical): Not on file  Physical Activity:   . Days of Exercise per Week: Not on file  . Minutes of Exercise per Session: Not on file  Stress:   . Feeling of Stress : Not on file  Social Connections:   . Frequency of Communication with Friends and Family: Not on file  . Frequency of Social Gatherings with Friends and Family: Not on file  . Attends Religious Services: Not on file  . Active Member of Clubs or Organizations: Not on file  . Attends Archivist  Meetings: Not on file  . Marital Status: Not on file  Intimate Partner Violence:   . Fear of Current or Ex-Partner: Not on file  . Emotionally Abused: Not on file  . Physically Abused: Not on file  . Sexually Abused: Not on file     FAMILY HISTORY Family History  Problem Relation Age of Onset  . Prostate cancer Maternal Grandfather   . Hypertension Maternal Grandfather   . Hypertension Paternal Grandmother       Review of Systems: 12 systems reviewed Otherwise as per HPI, all other systems reviewed and negative  Physical Exam: Vitals:   12/08/19 0845 12/08/19 0900  BP: (!) 154/82 (!) 156/83  Pulse: (!) 111 (!) 104  Resp: 20 (!) 22  Temp:    SpO2: 97% 97%   Total I/O In: 6.6 [I.V.:6.6] Out: 600 [Urine:600]  Intake/Output Summary (Last 24 hours) at 12/08/2019 0920 Last data filed at 12/08/2019 0800 Gross per 24 hour  Intake 60.35 ml  Output 1650 ml  Net -1589.65 ml   General: well-appearing, no acute distress HEENT: anicteric sclera, oropharynx clear without lesions CV: Tachycardia, no obvious murmurs, no edema Lungs: clear to auscultation bilaterally, normal work of breathing Abd: soft, non-tender, non-distended Skin: no  visible lesions or rashes Psych: alert, engaged, appropriate mood and affect Musculoskeletal: no obvious deformities Neuro: normal speech, moves all 4 limbs spontaneously  Test Results Reviewed Lab Results  Component Value Date   NA 133 (L) 12/08/2019   K 3.4 (L) 12/08/2019   CL 100 12/08/2019   CO2 20 (L) 12/08/2019   BUN 57 (H) 12/08/2019   CREATININE 10.08 (H) 12/08/2019   CALCIUM 8.7 (L) 12/08/2019   ALBUMIN 3.5 04/22/2017     I have reviewed all relevant outside healthcare records related to the patient's current hospitalization

## 2019-12-08 NOTE — ED Notes (Signed)
[facility] called for transport. 

## 2019-12-09 ENCOUNTER — Inpatient Hospital Stay (HOSPITAL_COMMUNITY): Payer: 59

## 2019-12-09 DIAGNOSIS — I161 Hypertensive emergency: Secondary | ICD-10-CM | POA: Diagnosis not present

## 2019-12-09 DIAGNOSIS — I160 Hypertensive urgency: Secondary | ICD-10-CM | POA: Diagnosis not present

## 2019-12-09 LAB — BASIC METABOLIC PANEL
Anion gap: 15 (ref 5–15)
BUN: 60 mg/dL — ABNORMAL HIGH (ref 6–20)
CO2: 19 mmol/L — ABNORMAL LOW (ref 22–32)
Calcium: 9.1 mg/dL (ref 8.9–10.3)
Chloride: 101 mmol/L (ref 98–111)
Creatinine, Ser: 10.97 mg/dL — ABNORMAL HIGH (ref 0.61–1.24)
GFR, Estimated: 6 mL/min — ABNORMAL LOW (ref >60)
Glucose, Bld: 99 mg/dL (ref 70–99)
Potassium: 3.5 mmol/L (ref 3.5–5.1)
Sodium: 135 mmol/L (ref 135–145)

## 2019-12-09 LAB — CBC
HCT: 33.6 % — ABNORMAL LOW (ref 39.0–52.0)
Hemoglobin: 11.1 g/dL — ABNORMAL LOW (ref 13.0–17.0)
MCH: 30.7 pg (ref 26.0–34.0)
MCHC: 33 g/dL (ref 30.0–36.0)
MCV: 92.8 fL (ref 80.0–100.0)
Platelets: 244 10*3/uL (ref 150–400)
RBC: 3.62 MIL/uL — ABNORMAL LOW (ref 4.22–5.81)
RDW: 14 % (ref 11.5–15.5)
WBC: 10.1 10*3/uL (ref 4.0–10.5)
nRBC: 0 % (ref 0.0–0.2)

## 2019-12-09 LAB — TSH: TSH: 1.992 u[IU]/mL (ref 0.350–4.500)

## 2019-12-09 MED ORDER — POLYETHYLENE GLYCOL 3350 17 G PO PACK: 17.0000 g | PACK | Freq: Every day | ORAL | Status: DC | PRN

## 2019-12-09 MED ORDER — DOCUSATE SODIUM 100 MG PO CAPS: 100.0000 mg | ORAL_CAPSULE | Freq: Every day | ORAL | Status: DC | PRN

## 2019-12-09 NOTE — Progress Notes (Addendum)
Pts SBP with cuff has been ~20 points less than arterial line all day. Pts a-line has good wave form, appears to be working well/accurate, however pts pressures have been very labile all day. Titrating cleviprex gtt based on a-line and have had to increase to 11 at this point as pts BP not responding. Relayed to Nephrology as well as CCM.

## 2019-12-09 NOTE — Progress Notes (Signed)
Patient ID: Robert Conner, male   DOB: 10/11/1995, 24 y.o.   MRN: [id number] S: Feels "like a million bucks".  No events overnight. O:BP 126/65 (BP Location: Right Arm)   Pulse 96   Temp 98.3 F (36.8 C) (Oral)   Resp (!) 24   Ht 5\' 6"  (1.676 m)   Wt 96.2 kg   SpO2 96%   BMI 34.22 kg/m   Intake/Output Summary (Last 24 hours) at 12/09/2019 1231 Last data filed at 12/09/2019 1200 Gross per 24 hour  Intake 695.64 ml  Output 325 ml  Net 370.64 ml   Intake/Output: I/O last 3 completed shifts: In: 519.2 [P.O.:240; I.V.:279.2] Out: 1875 [Urine:1875]  Intake/Output this shift:  Total I/O In: 514.6 [P.O.:480; I.V.:34.6] Out: 100 [Urine:100] Weight change:  Gen: NAD CVS: RRR no rub Resp: CTA Abd: +BS, soft, NT/ND Ext: no edema  Recent Labs  Lab 12/07/19 1528 12/08/19 0727 12/09/19 1046  NA 135 133* 135  K 4.6 3.4* 3.5  CL 98 100 101  CO2 21* 20* 19*  GLUCOSE 94 101* 99  BUN 61* 57* 60*  CREATININE 10.10* 10.08* 10.97*  CALCIUM 9.5 8.7* 9.1   Liver Function Tests: No results for input(s): AST, ALT, ALKPHOS, BILITOT, PROT, ALBUMIN in the last 168 hours. No results for input(s): LIPASE, AMYLASE in the last 168 hours. No results for input(s): AMMONIA in the last 168 hours. CBC: Recent Labs  Lab 12/07/19 1528 12/08/19 0727 12/09/19 1046  WBC 13.2* 13.8* 10.1  NEUTROABS 10.7*  --   --   HGB 14.0 11.7* 11.1*  HCT 40.3 33.8* 33.6*  MCV 90.2 89.9 92.8  PLT 171 173 244   Cardiac Enzymes: No results for input(s): CKTOTAL, CKMB, CKMBINDEX, TROPONINI in the last 168 hours. CBG: Recent Labs  Lab 12/07/19 1710 12/08/19 0151  GLUCAP 123* 114*    Iron Studies: No results for input(s): IRON, TIBC, TRANSFERRIN, FERRITIN in the last 72 hours. Studies/Results: CT Head Wo Contrast  Result Date: 12/07/2019 CLINICAL DATA:  Headache, blurred vision, papilledema, hypertension EXAM: CT HEAD WITHOUT CONTRAST TECHNIQUE: Contiguous axial images were obtained from the base of  the skull through the vertex without intravenous contrast. COMPARISON:  04/21/2017 FINDINGS: Brain: No evidence of acute infarction, hemorrhage, hydrocephalus, extra-axial collection or mass lesion/mass effect. Vascular: No hyperdense vessel or unexpected calcification. Skull: Normal. Negative for fracture or focal lesion. Sinuses/Orbits: Paranasal sinuses and mastoid air cells are clear. Orbits and optic nerves appear within normal limits. Other: None. IMPRESSION: No acute intracranial findings by CT. Electronically Signed   By: Davina Poke D.O.   On: 12/07/2019 16:25   MR ANGIO HEAD WO CONTRAST  Result Date: 12/08/2019 CLINICAL DATA:  Initial evaluation for acute papilledema. EXAM: MRI ORBITS WITHOUT CONTRAST MRA HEAD WITHOUT CONTRAST MRV HEAD WITHOUT CONTRAST TECHNIQUE: Multiplanar, multiecho pulse sequences of the brain and surrounding structures were obtained without intravenous contrast. Angiographic images of the intracranial venous structures were obtained using MRV technique without intravenous contrast. Angiographic images of the intracranial arterial structures were obtained using MRA technique without intravenous contrast. COMPARISON:  None. FINDINGS: MRI HEAD AND ORBITS WITHOUT CONTRAST Brain: Mildly advanced cerebral atrophy for age. Scattered patchy T2/FLAIR hyperintensity within the periventricular and deep white matter both cerebral hemispheres, nonspecific, but suspected to reflect small vessel ischemic changes, advanced for age. There is diffuse abnormal T2/FLAIR signal abnormality seen throughout the brainstem, with involvement of the midbrain, pons, and medulla. Extension into the left greater than right cerebral peduncles towards the posterior limbs  of both internal capsules. Extensive patchy involvement throughout the cerebellum as well, fairly diffuse and symmetric in nature. Associated diffuse swelling and edema throughout the areas affected, with secondary crowding about the  basilar cisterns. Fourth ventricle is partially effaced and slit-like. Slightly increased ventricular dilatation of the lateral and third ventricles as compared to previous head CT from 04/21/2017, suggesting mild hydrocephalus. Thin rim of FLAIR hyperintensity about the lateral ventricles suspected to reflect a degree of transependymal flow of CSF, most pronounced at the left temporal pole (series 15, image 9). No frank herniation of the cerebellar tonsils through the foramen magnum at this time. Associated minimal susceptibility artifact at the right pontine region suggestive of associated minimal petechial hemorrhage (series 18, image 14). No diffusion abnormality seen elsewhere within the brain to suggest acute or subacute ischemia. Gray-white matter differentiation otherwise maintained. No encephalomalacia to suggest chronic cortical infarction. No other foci of susceptibility artifact to suggest acute or chronic intracranial hemorrhage. No mass lesion or midline shift. No extra-axial fluid collection. Pituitary gland and suprasellar region within normal limits. Midline structures intact. Vascular: Major intracranial vascular flow voids are maintained. Skull and upper cervical spine: Mild crowding at the craniocervical junction without frank transtentorial herniation. Visualized upper cervical spine within normal limits. Bone marrow signal intensity normal. No scalp soft tissue abnormality. Sinuses/Orbits: Globes are symmetric in size. Mild flattening at the posterior globes with subtle bulging of the optic nerve discs and increased CSF along the optic nerve sheaths, suggesting papilledema, and indicating elevated intracranial pressures. Changes most pronounced at the posterior right globe (series 14, image 9). No abnormality about the orbital apices, cavernous sinus, or suprasellar cistern. Optic chiasm normally situated. Superior orbital veins symmetric and within normal limits. Intraconal and extraconal fat  well-maintained. Extra-ocular muscles symmetric and normal. Lacrimal glands normal. Mild scattered mucosal thickening noted within the ethmoidal air cells. Small right sphenoid sinus retention cyst. Paranasal sinuses are otherwise clear. Other: No mastoid effusion.  Inner ear structures grossly normal. MR VENOGRAM WITHOUT CONTRAST Examination somewhat technically limited by patient positioning and rotation. Normal flow related signal seen throughout the anterior and mid superior sagittal sinus. Focal signal loss at the posterior aspect of the superior sagittal sinus could reflect a focal stenosis (series 1031, image 10). No abnormal T1 hyperintensity, FLAIR hyperintensity, or susceptibility artifacts seen at this location to suggest focal thrombus. Superior sagittal sinus otherwise patent distally to the torcula. Torcula itself is patent. Normal flow related signal seen within the transverse and sigmoid sinuses as well as the partially visualized proximal internal jugular veins. Right transverse sinus dominant. Straight sinus appears grossly patent at its origin. Straight sinus, vein of Galen, and internal cerebral veins not well seen distally, felt to be most likely artifactual and technical due to patient positioning. Normal flow voids are seen within the partially visualized straight sinus, vein of Galen, internal cerebral veins, and basal veins of Rosenthal on corresponding T2 weighted sequences. No convincing imaging findings to suggest dural venous thrombosis. MRA HEAD WITHOUT CONTRAST ANTERIOR CIRCULATION: Visualized distal cervical segments of the internal carotid arteries are widely patent with symmetric antegrade flow. Petrous, cavernous, and supraclinoid segments widely patent without stenosis or other abnormality. A1 segments widely patent. Normal anterior communicating artery complex. Anterior cerebral arteries patent to their distal aspects without stenosis. No M1 stenosis or occlusion. Normal MCA  bifurcations. Distal MCA branches well perfused and symmetric. POSTERIOR CIRCULATION: Both vertebral arteries widely patent to the vertebrobasilar junction without stenosis. Neither PICA well visualized. Basilar patent  to its distal aspect without stenosis. Superior cerebral arteries patent bilaterally. Both PCA supplied via the basilar as well as small bilateral posterior communicating arteries. Both PCAs well perfused to their distal aspects. IMPRESSION: MRI HEAD IMPRESSION: 1. Diffuse abnormal T2/FLAIR signal abnormality throughout the brainstem and cerebellum, with extension into the left greater than right cerebral peduncles. Findings are nonspecific, with primary differential considerations including changes related to acute hypertensive encephalopathy/atypical PRES or possibly toxic metabolic derangement. Correlation with history and laboratory values recommended. 2. Associated diffuse swelling and edema throughout the areas affected, with secondary crowding about the basilar cisterns, and partial effacement of the fourth ventricle. Slightly increased ventricular dilatation of the lateral and third ventricles suggesting secondary mild hydrocephalus. Thin rim of FLAIR hyperintensity about the lateral ventricles suspected to reflect a degree of transependymal flow of CSF. 3. Associated papilledema at the posterior globes, consistent with elevated ICP. MRA HEAD IMPRESSION: Normal intracranial MRA MRV HEAD IMPRESSION: 1. No convincing findings to suggest dural venous thrombosis. 2. Focal signal loss at the posterior aspect of the superior sagittal sinus, which could reflect a focal stenosis. 3. Non visualization of the straight sinus and majority of the deep venous system, felt to be technical in artifactual nature on this exam due to patient positioning. Normal flow voids are seen within the partially visualized straight sinus, vein of Galen, and internal cerebral veins on corresponding brain MRI. Results were  discussed by telephone at the time of interpretation on 12/08/2019 at 5:19 am to provider Dr. Gillermina Phy, who verbally acknowledged these results. Electronically Signed   By: Jeannine Boga M.D.   On: 12/08/2019 05:20   MR Venogram Head  Result Date: 12/08/2019 CLINICAL DATA:  Initial evaluation for acute papilledema. EXAM: MRI ORBITS WITHOUT CONTRAST MRA HEAD WITHOUT CONTRAST MRV HEAD WITHOUT CONTRAST TECHNIQUE: Multiplanar, multiecho pulse sequences of the brain and surrounding structures were obtained without intravenous contrast. Angiographic images of the intracranial venous structures were obtained using MRV technique without intravenous contrast. Angiographic images of the intracranial arterial structures were obtained using MRA technique without intravenous contrast. COMPARISON:  None. FINDINGS: MRI HEAD AND ORBITS WITHOUT CONTRAST Brain: Mildly advanced cerebral atrophy for age. Scattered patchy T2/FLAIR hyperintensity within the periventricular and deep white matter both cerebral hemispheres, nonspecific, but suspected to reflect small vessel ischemic changes, advanced for age. There is diffuse abnormal T2/FLAIR signal abnormality seen throughout the brainstem, with involvement of the midbrain, pons, and medulla. Extension into the left greater than right cerebral peduncles towards the posterior limbs of both internal capsules. Extensive patchy involvement throughout the cerebellum as well, fairly diffuse and symmetric in nature. Associated diffuse swelling and edema throughout the areas affected, with secondary crowding about the basilar cisterns. Fourth ventricle is partially effaced and slit-like. Slightly increased ventricular dilatation of the lateral and third ventricles as compared to previous head CT from 04/21/2017, suggesting mild hydrocephalus. Thin rim of FLAIR hyperintensity about the lateral ventricles suspected to reflect a degree of transependymal flow of CSF, most pronounced at the  left temporal pole (series 15, image 9). No frank herniation of the cerebellar tonsils through the foramen magnum at this time. Associated minimal susceptibility artifact at the right pontine region suggestive of associated minimal petechial hemorrhage (series 18, image 14). No diffusion abnormality seen elsewhere within the brain to suggest acute or subacute ischemia. Gray-white matter differentiation otherwise maintained. No encephalomalacia to suggest chronic cortical infarction. No other foci of susceptibility artifact to suggest acute or chronic intracranial hemorrhage. No mass lesion or midline  shift. No extra-axial fluid collection. Pituitary gland and suprasellar region within normal limits. Midline structures intact. Vascular: Major intracranial vascular flow voids are maintained. Skull and upper cervical spine: Mild crowding at the craniocervical junction without frank transtentorial herniation. Visualized upper cervical spine within normal limits. Bone marrow signal intensity normal. No scalp soft tissue abnormality. Sinuses/Orbits: Globes are symmetric in size. Mild flattening at the posterior globes with subtle bulging of the optic nerve discs and increased CSF along the optic nerve sheaths, suggesting papilledema, and indicating elevated intracranial pressures. Changes most pronounced at the posterior right globe (series 14, image 9). No abnormality about the orbital apices, cavernous sinus, or suprasellar cistern. Optic chiasm normally situated. Superior orbital veins symmetric and within normal limits. Intraconal and extraconal fat well-maintained. Extra-ocular muscles symmetric and normal. Lacrimal glands normal. Mild scattered mucosal thickening noted within the ethmoidal air cells. Small right sphenoid sinus retention cyst. Paranasal sinuses are otherwise clear. Other: No mastoid effusion.  Inner ear structures grossly normal. MR VENOGRAM WITHOUT CONTRAST Examination somewhat technically limited  by patient positioning and rotation. Normal flow related signal seen throughout the anterior and mid superior sagittal sinus. Focal signal loss at the posterior aspect of the superior sagittal sinus could reflect a focal stenosis (series 1031, image 10). No abnormal T1 hyperintensity, FLAIR hyperintensity, or susceptibility artifacts seen at this location to suggest focal thrombus. Superior sagittal sinus otherwise patent distally to the torcula. Torcula itself is patent. Normal flow related signal seen within the transverse and sigmoid sinuses as well as the partially visualized proximal internal jugular veins. Right transverse sinus dominant. Straight sinus appears grossly patent at its origin. Straight sinus, vein of Galen, and internal cerebral veins not well seen distally, felt to be most likely artifactual and technical due to patient positioning. Normal flow voids are seen within the partially visualized straight sinus, vein of Galen, internal cerebral veins, and basal veins of Rosenthal on corresponding T2 weighted sequences. No convincing imaging findings to suggest dural venous thrombosis. MRA HEAD WITHOUT CONTRAST ANTERIOR CIRCULATION: Visualized distal cervical segments of the internal carotid arteries are widely patent with symmetric antegrade flow. Petrous, cavernous, and supraclinoid segments widely patent without stenosis or other abnormality. A1 segments widely patent. Normal anterior communicating artery complex. Anterior cerebral arteries patent to their distal aspects without stenosis. No M1 stenosis or occlusion. Normal MCA bifurcations. Distal MCA branches well perfused and symmetric. POSTERIOR CIRCULATION: Both vertebral arteries widely patent to the vertebrobasilar junction without stenosis. Neither PICA well visualized. Basilar patent to its distal aspect without stenosis. Superior cerebral arteries patent bilaterally. Both PCA supplied via the basilar as well as small bilateral posterior  communicating arteries. Both PCAs well perfused to their distal aspects. IMPRESSION: MRI HEAD IMPRESSION: 1. Diffuse abnormal T2/FLAIR signal abnormality throughout the brainstem and cerebellum, with extension into the left greater than right cerebral peduncles. Findings are nonspecific, with primary differential considerations including changes related to acute hypertensive encephalopathy/atypical PRES or possibly toxic metabolic derangement. Correlation with history and laboratory values recommended. 2. Associated diffuse swelling and edema throughout the areas affected, with secondary crowding about the basilar cisterns, and partial effacement of the fourth ventricle. Slightly increased ventricular dilatation of the lateral and third ventricles suggesting secondary mild hydrocephalus. Thin rim of FLAIR hyperintensity about the lateral ventricles suspected to reflect a degree of transependymal flow of CSF. 3. Associated papilledema at the posterior globes, consistent with elevated ICP. MRA HEAD IMPRESSION: Normal intracranial MRA MRV HEAD IMPRESSION: 1. No convincing findings to suggest dural venous  thrombosis. 2. Focal signal loss at the posterior aspect of the superior sagittal sinus, which could reflect a focal stenosis. 3. Non visualization of the straight sinus and majority of the deep venous system, felt to be technical in artifactual nature on this exam due to patient positioning. Normal flow voids are seen within the partially visualized straight sinus, vein of Galen, and internal cerebral veins on corresponding brain MRI. Results were discussed by telephone at the time of interpretation on 12/08/2019 at 5:19 am to provider Dr. Gillermina Phy, who verbally acknowledged these results. Electronically Signed   By: Jeannine Boga M.D.   On: 12/08/2019 05:20   VAS US RENAL ARTERY DUPLEX  Result Date: 12/09/2019 ABDOMINAL VISCERAL Indications: Severe resistant hypertension. Comparison Study: No prior.  Performing Technologist: Oda Cogan RDMS, RVT  Examination Guidelines: A complete evaluation includes B-mode imaging, spectral Doppler, color Doppler, and power Doppler as needed of all accessible portions of each vessel. Bilateral testing is considered an integral part of a complete examination. Limited examinations for reoccurring indications may be performed as noted.  Duplex Findings: +--------------------+--------+--------+------+--------+ Mesenteric          PSV cm/sEDV cm/sPlaqueComments +--------------------+--------+--------+------+--------+ Aorta Prox             96                          +--------------------+--------+--------+------+--------+ Celiac Artery Origin  161                          +--------------------+--------+--------+------+--------+ SMA Proximal          245                          +--------------------+--------+--------+------+--------+    +------------------+--------+--------+-------+ Right Renal ArteryPSV cm/sEDV cm/sComment +------------------+--------+--------+-------+ Origin              128      75           +------------------+--------+--------+-------+ Proximal            115      22           +------------------+--------+--------+-------+ Mid                  68      17           +------------------+--------+--------+-------+ Distal               69      21           +------------------+--------+--------+-------+ +-----------------+--------+--------+-------+ Left Renal ArteryPSV cm/sEDV cm/sComment +-----------------+--------+--------+-------+ Origin              75      13           +-----------------+--------+--------+-------+ Proximal            83      16           +-----------------+--------+--------+-------+ Mid                 85      21           +-----------------+--------+--------+-------+ Distal              85      21           +-----------------+--------+--------+-------+  +------------+--------+--------+----+-----------+--------+--------+----+ Right KidneyPSV cm/sEDV cm/sRI  Left KidneyPSV cm/sEDV cm/sRI   +------------+--------+--------+----+-----------+--------+--------+----+  Upper Pole  18      7       0.60Upper Pole 65      9       0.80 +------------+--------+--------+----+-----------+--------+--------+----+ Mid         27      6       0.71Mid        27      10      0.62 +------------+--------+--------+----+-----------+--------+--------+----+ Lower Pole  24      6       0.73Lower Pole 31      9       0.71 +------------+--------+--------+----+-----------+--------+--------+----+ Hilar       29      8       0.72Hilar      37      11      0.71 +------------+--------+--------+----+-----------+--------+--------+----+ +------------------+----+------------------+----+ Right Kidney          Left Kidney            +------------------+----+------------------+----+ RAR                   RAR                    +------------------+----+------------------+----+ RAR (manual)          RAR (manual)           +------------------+----+------------------+----+ Cortex                Cortex                 +------------------+----+------------------+----+ Cortex thickness      Corex thickness        +------------------+----+------------------+----+ Kidney length (cm)9.50Kidney length (cm)9.25 +------------------+----+------------------+----+   Summary: Renal:  Right: Patent renal artery without evidence of stenosis. Abnormal        right Resistive Index. Left:  Patent renal artery without evidence of stenosis. Abnormal        left Resisitve Index.        Echogenic kidneys bilaterally suggestive of medical renal        disease.  *See table(s) above for measurements and observations.     Preliminary    MR ORBITS WO CONTRAST  Result Date: 12/08/2019 CLINICAL DATA:  Initial evaluation for acute papilledema. EXAM: MRI ORBITS  WITHOUT CONTRAST MRA HEAD WITHOUT CONTRAST MRV HEAD WITHOUT CONTRAST TECHNIQUE: Multiplanar, multiecho pulse sequences of the brain and surrounding structures were obtained without intravenous contrast. Angiographic images of the intracranial venous structures were obtained using MRV technique without intravenous contrast. Angiographic images of the intracranial arterial structures were obtained using MRA technique without intravenous contrast. COMPARISON:  None. FINDINGS: MRI HEAD AND ORBITS WITHOUT CONTRAST Brain: Mildly advanced cerebral atrophy for age. Scattered patchy T2/FLAIR hyperintensity within the periventricular and deep white matter both cerebral hemispheres, nonspecific, but suspected to reflect small vessel ischemic changes, advanced for age. There is diffuse abnormal T2/FLAIR signal abnormality seen throughout the brainstem, with involvement of the midbrain, pons, and medulla. Extension into the left greater than right cerebral peduncles towards the posterior limbs of both internal capsules. Extensive patchy involvement throughout the cerebellum as well, fairly diffuse and symmetric in nature. Associated diffuse swelling and edema throughout the areas affected, with secondary crowding about the basilar cisterns. Fourth ventricle is partially effaced and slit-like. Slightly increased ventricular dilatation of the lateral and third ventricles as compared to previous head CT from 04/21/2017, suggesting mild hydrocephalus. Thin rim of FLAIR hyperintensity about the lateral  ventricles suspected to reflect a degree of transependymal flow of CSF, most pronounced at the left temporal pole (series 15, image 9). No frank herniation of the cerebellar tonsils through the foramen magnum at this time. Associated minimal susceptibility artifact at the right pontine region suggestive of associated minimal petechial hemorrhage (series 18, image 14). No diffusion abnormality seen elsewhere within the brain to suggest  acute or subacute ischemia. Gray-white matter differentiation otherwise maintained. No encephalomalacia to suggest chronic cortical infarction. No other foci of susceptibility artifact to suggest acute or chronic intracranial hemorrhage. No mass lesion or midline shift. No extra-axial fluid collection. Pituitary gland and suprasellar region within normal limits. Midline structures intact. Vascular: Major intracranial vascular flow voids are maintained. Skull and upper cervical spine: Mild crowding at the craniocervical junction without frank transtentorial herniation. Visualized upper cervical spine within normal limits. Bone marrow signal intensity normal. No scalp soft tissue abnormality. Sinuses/Orbits: Globes are symmetric in size. Mild flattening at the posterior globes with subtle bulging of the optic nerve discs and increased CSF along the optic nerve sheaths, suggesting papilledema, and indicating elevated intracranial pressures. Changes most pronounced at the posterior right globe (series 14, image 9). No abnormality about the orbital apices, cavernous sinus, or suprasellar cistern. Optic chiasm normally situated. Superior orbital veins symmetric and within normal limits. Intraconal and extraconal fat well-maintained. Extra-ocular muscles symmetric and normal. Lacrimal glands normal. Mild scattered mucosal thickening noted within the ethmoidal air cells. Small right sphenoid sinus retention cyst. Paranasal sinuses are otherwise clear. Other: No mastoid effusion.  Inner ear structures grossly normal. MR VENOGRAM WITHOUT CONTRAST Examination somewhat technically limited by patient positioning and rotation. Normal flow related signal seen throughout the anterior and mid superior sagittal sinus. Focal signal loss at the posterior aspect of the superior sagittal sinus could reflect a focal stenosis (series 1031, image 10). No abnormal T1 hyperintensity, FLAIR hyperintensity, or susceptibility artifacts seen at  this location to suggest focal thrombus. Superior sagittal sinus otherwise patent distally to the torcula. Torcula itself is patent. Normal flow related signal seen within the transverse and sigmoid sinuses as well as the partially visualized proximal internal jugular veins. Right transverse sinus dominant. Straight sinus appears grossly patent at its origin. Straight sinus, vein of Galen, and internal cerebral veins not well seen distally, felt to be most likely artifactual and technical due to patient positioning. Normal flow voids are seen within the partially visualized straight sinus, vein of Galen, internal cerebral veins, and basal veins of Rosenthal on corresponding T2 weighted sequences. No convincing imaging findings to suggest dural venous thrombosis. MRA HEAD WITHOUT CONTRAST ANTERIOR CIRCULATION: Visualized distal cervical segments of the internal carotid arteries are widely patent with symmetric antegrade flow. Petrous, cavernous, and supraclinoid segments widely patent without stenosis or other abnormality. A1 segments widely patent. Normal anterior communicating artery complex. Anterior cerebral arteries patent to their distal aspects without stenosis. No M1 stenosis or occlusion. Normal MCA bifurcations. Distal MCA branches well perfused and symmetric. POSTERIOR CIRCULATION: Both vertebral arteries widely patent to the vertebrobasilar junction without stenosis. Neither PICA well visualized. Basilar patent to its distal aspect without stenosis. Superior cerebral arteries patent bilaterally. Both PCA supplied via the basilar as well as small bilateral posterior communicating arteries. Both PCAs well perfused to their distal aspects. IMPRESSION: MRI HEAD IMPRESSION: 1. Diffuse abnormal T2/FLAIR signal abnormality throughout the brainstem and cerebellum, with extension into the left greater than right cerebral peduncles. Findings are nonspecific, with primary differential considerations including  changes related to acute  hypertensive encephalopathy/atypical PRES or possibly toxic metabolic derangement. Correlation with history and laboratory values recommended. 2. Associated diffuse swelling and edema throughout the areas affected, with secondary crowding about the basilar cisterns, and partial effacement of the fourth ventricle. Slightly increased ventricular dilatation of the lateral and third ventricles suggesting secondary mild hydrocephalus. Thin rim of FLAIR hyperintensity about the lateral ventricles suspected to reflect a degree of transependymal flow of CSF. 3. Associated papilledema at the posterior globes, consistent with elevated ICP. MRA HEAD IMPRESSION: Normal intracranial MRA MRV HEAD IMPRESSION: 1. No convincing findings to suggest dural venous thrombosis. 2. Focal signal loss at the posterior aspect of the superior sagittal sinus, which could reflect a focal stenosis. 3. Non visualization of the straight sinus and majority of the deep venous system, felt to be technical in artifactual nature on this exam due to patient positioning. Normal flow voids are seen within the partially visualized straight sinus, vein of Galen, and internal cerebral veins on corresponding brain MRI. Results were discussed by telephone at the time of interpretation on 12/08/2019 at 5:19 am to provider Dr. Gillermina Phy, who verbally acknowledged these results. Electronically Signed   By: Jeannine Boga M.D.   On: 12/08/2019 05:20   . amLODipine  10 mg Oral Daily  . Chlorhexidine Gluconate Cloth  6 each Topical Daily  . heparin  5,000 Units Subcutaneous Q8H  . hydrALAZINE  100 mg Oral Q8H    BMET    Component Value Date/Time   NA 135 12/09/2019 1046   K 3.5 12/09/2019 1046   CL 101 12/09/2019 1046   CO2 19 (L) 12/09/2019 1046   GLUCOSE 99 12/09/2019 1046   BUN 60 (H) 12/09/2019 1046   CREATININE 10.97 (H) 12/09/2019 1046   CALCIUM 9.1 12/09/2019 1046   GFRNONAA 6 (L) 12/09/2019 1046   GFRAA 33 (L)  04/22/2017 0448   CBC    Component Value Date/Time   WBC 10.1 12/09/2019 1046   RBC 3.62 (L) 12/09/2019 1046   HGB 11.1 (L) 12/09/2019 1046   HCT 33.6 (L) 12/09/2019 1046   PLT 244 12/09/2019 1046   MCV 92.8 12/09/2019 1046   MCH 30.7 12/09/2019 1046   MCHC 33.0 12/09/2019 1046   RDW 14.0 12/09/2019 1046   LYMPHSABS 2.0 12/07/2019 1528   MONOABS 0.8 12/07/2019 1528   EOSABS 0.0 12/07/2019 1528   BASOSABS 0.1 12/07/2019 1528     Assessment/Plan:  1. 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  His Scr was 3.97 on 06/07/18.  None in the interim and likely represents progressive CKD.  Discussed the likelihood of requiring ongoing dialysis but he states that he is not ready to start yet.  Will consult [facility] for AVF/AVG +/- TDC placement if his BUN/Cr continue to climb.  No uremic symptoms at this time.  2. Hypertensive emergency- presented with headache and fatigue.  Imaging studies with possible atypical PRES.  BP markedly improved with clevidipine drip.  Also on amlodipine 10 mg daily, hydralazine 100 mg tid.  Lisinopril on hold due to AKI/CKD 1. BP overly corrected this morning as goal is SBP of 140-150.  Decrease clevidipine drip and hold po meds for SBP <140 for now.  2. Renal artery duplex without evidence of RAS, UDS negative 3. Continue with secondary HTN workup (renin, aldo, catecholamines and metanephrines pending) 3. Hyponatremia- improved 4. Metabolic acidosis- stable 5. Anemia of CKD- no indication for ESA with Hgb >  10 at this time. 6. SHPTH- will order iPTH levels and follow Ca/phos. 7. Hypokalemia- stable.  Donetta Potts, MD [employer] [phone]

## 2019-12-09 NOTE — Progress Notes (Signed)
Per CCM MD, SBP goal now 140-150.

## 2019-12-09 NOTE — Progress Notes (Signed)
NAME:  Robert Conner, MRN:  [id number], DOB:  1995-04-07, LOS: 1 ADMISSION DATE:  12/07/2019, CONSULTATION DATE:  12/08/2019 REFERRING MD:  Rupert Stacks, CHIEF COMPLAINT:  Hypertensive emergency   Brief History   Robert Conner is a 24 yo man with a PMHx of HTN who presented to [facility] ED on 12/07/19 due to headache and blurry vision found to have hypertensive emergency. He had been referred to the ED from the ophthalmology office where he was found to have papilledema in both eyes.   History of present illness   Robert Conner is a 24 yo man with a PMHx of HTN who presented to [facility] ED on 12/07/19 due to headache and blurry vision found to have hypertensive emergency. He had been referred to the ED from the ophthalmology office where he was found to have papilledema in both eyes.  Robert Conner had a 4 day history of headache and then developed blurry vision which prompted him to seek ophthalmology evaluation. He has a history of high blood pressure at baseline but has not been taking his blood pressure medications. (Per ED note, on amlodipine 10 mg daily, Lisinopril 20 mg, HCTZ 12.5 daily)  In the ED his blood pressure was initially 175/133 with highest recorded blood pressure of 256/172. He was started on a clevidipine drip. He had a head CT which was without acute findings. His creatinine was found to be elevated to 10 (baseline from 2019 of 3).  Past Medical History  HTN, not compliant with medications  Significant Hospital Events   Admitted 11/27 for hypertensive emergency  Consults:  Nephrology consult in the am  Procedures:  Plan for arterial line placement  Significant Diagnostic Tests:  MRI orbits  1. Diffuse abnormal T2/FLAIR signal abnormality throughout the brainstem and cerebellum, with extension into the left greater than right cerebral peduncles. Findings are nonspecific, with primary differential considerations including changes related to acute hypertensive  encephalopathy/atypical PRES or possibly toxic metabolic derangement.  2. Associated diffuse swelling and edema throughout the areas affected, with secondary crowding about the basilar cisterns, and partial effacement of the fourth ventricle. Slightly increased ventricular dilatation of the lateral and third ventricles suggesting secondary mild hydrocephalus. Thin rim of FLAIR hyperintensity about the lateral ventricles suspected to reflect a degree of transependymal flow of CSF. 3. Associated papilledema at the posterior globes, consistent with elevated ICP.  MRA Normal intracranial MRA  MRV: 1. No convincing findings to suggest dural venous thrombosis. 2. Focal signal loss at the posterior aspect of the superior sagittal sinus, which could reflect a focal stenosis. 3. Non visualization of the straight sinus and majority of the deep venous system, felt to be technical in artifactual nature on this exam due to patient positioning. Normal flow voids are seen within the partially visualized straight sinus, vein of Galen, and internal cerebral veins on corresponding brain MRI  CT head 12/07/19: No acute intracranial findings  Micro Data:  NA  Antimicrobials:  NA  Interim history/subjective:  No acute overnight events. This morning he is feeling well, continues to have some right sided throbbing headache and diplopia. Denies ever having double vision or vision problems aside from getting glasses. States he has had elevated blood pressure since highschool but only started on medication in the past couple of years. States he had a negative at home sleep study for OSA about 2 years ago.  Objective   Blood pressure 138/77, pulse (!) 104, temperature 98.4 F (36.9 C), temperature source Axillary, resp.  rate (!) 25, height 5\' 6"  (1.676 m), weight 96.2 kg, SpO2 98 %.        Intake/Output Summary (Last 24 hours) at 12/09/2019 0720 Last data filed at 12/09/2019 0600 Gross per 24  hour  Intake 457 ml  Output 825 ml  Net -368 ml   Filed Weights   12/07/19 1438  Weight: 96.2 kg    Examination: Constitutional: young man in NAD  Eyes: EOMI, pupils equal, medial deviation of right eye Ears, nose, mouth, and throat: MMM, trachea midline Cardiovascular: RRR, ext warm Respiratory: clear bilaterally, no accessory muscle use Gastrointestinal: soft, +BS Skin: No rashes, normal turgor Neurologic: Moves all 4 ext to command, good  strength Psychiatric:normal mood, cooperative  Resolved Hospital Problem list   NA  Assessment & Plan:  Hypertensive Emergency- BP decreasing on medications, will continue to try to transition to oral medications with BP goal of 062-376 systolic today. Increased ICP on fundal exam by ophthalmology as well as on MRI. Currently undergoing workup for secondary causes. -Follow up renal artery [place] -Urine metanephrines and catecholamines, aldosterone, rennin, TSH  CKD5- approaching need for HD, do not think this is an acute injury.  US kidneys in 2019 showed marked kidney injury.  Still making urine. - Consult to nephrology to help workup secondary causes of hypertension and approaching end stage renal disease - Check utox - Plan to monitor vision clinically as Bps trend to normal and repeat fundus exam in a few days  Best practice (evaluated daily)  Diet: heart healthy Pain/Anxiety/Delirium protocol (if indicated): NA VAP protocol (if indicated): NA DVT prophylaxis: Patient mobile, no indication for pharmacologic prophylaxis GI prophylaxis: NA Glucose control: Will monitor sugars Mobility: Out of bed as tolerated last date of multidisciplinary goals of care discussion: NA Family and staff present: NA Summary of discussion: NA Follow up goals of care discussion due: NA Code Status: Full Disposition: ICU  Iona Beard PGY-1 Internal medicine 12/09/19 11:45 AM

## 2019-12-10 DIAGNOSIS — N185 Chronic kidney disease, stage 5: Secondary | ICD-10-CM

## 2019-12-10 LAB — CBC
HCT: 34.3 % — ABNORMAL LOW (ref 39.0–52.0)
Hemoglobin: 11.2 g/dL — ABNORMAL LOW (ref 13.0–17.0)
MCH: 30.8 pg (ref 26.0–34.0)
MCHC: 32.7 g/dL (ref 30.0–36.0)
MCV: 94.2 fL (ref 80.0–100.0)
Platelets: 277 10*3/uL (ref 150–400)
RBC: 3.64 MIL/uL — ABNORMAL LOW (ref 4.22–5.81)
RDW: 13.9 % (ref 11.5–15.5)
WBC: 10.1 10*3/uL (ref 4.0–10.5)
nRBC: 0 % (ref 0.0–0.2)

## 2019-12-10 LAB — BASIC METABOLIC PANEL
Anion gap: 10 (ref 5–15)
BUN: 59 mg/dL — ABNORMAL HIGH (ref 6–20)
CO2: 20 mmol/L — ABNORMAL LOW (ref 22–32)
Calcium: 8.9 mg/dL (ref 8.9–10.3)
Chloride: 96 mmol/L — ABNORMAL LOW (ref 98–111)
Creatinine, Ser: 11.05 mg/dL — ABNORMAL HIGH (ref 0.61–1.24)
GFR, Estimated: 6 mL/min — ABNORMAL LOW (ref >60)
Glucose, Bld: 106 mg/dL — ABNORMAL HIGH (ref 70–99)
Potassium: 3.6 mmol/L (ref 3.5–5.1)
Sodium: 126 mmol/L — ABNORMAL LOW (ref 135–145)

## 2019-12-10 LAB — RENAL FUNCTION PANEL
Albumin: 3.2 g/dL — ABNORMAL LOW (ref 3.5–5.0)
Anion gap: 10 (ref 5–15)
BUN: 61 mg/dL — ABNORMAL HIGH (ref 6–20)
CO2: 19 mmol/L — ABNORMAL LOW (ref 22–32)
Calcium: 8.8 mg/dL — ABNORMAL LOW (ref 8.9–10.3)
Chloride: 99 mmol/L (ref 98–111)
Creatinine, Ser: 11.22 mg/dL — ABNORMAL HIGH (ref 0.61–1.24)
GFR, Estimated: 6 mL/min — ABNORMAL LOW (ref >60)
Glucose, Bld: 97 mg/dL (ref 70–99)
Phosphorus: 5.3 mg/dL — ABNORMAL HIGH (ref 2.5–4.6)
Potassium: 3.5 mmol/L (ref 3.5–5.1)
Sodium: 128 mmol/L — ABNORMAL LOW (ref 135–145)

## 2019-12-10 LAB — TRIGLYCERIDES: Triglycerides: 178 mg/dL — ABNORMAL HIGH (ref <150)

## 2019-12-10 LAB — VITAMIN D 25 HYDROXY (VIT D DEFICIENCY, FRACTURES): Vit D, 25-Hydroxy: 17.47 ng/mL — ABNORMAL LOW (ref 30–100)

## 2019-12-10 MED ORDER — LABETALOL HCL 200 MG PO TABS
100.0000 mg | ORAL_TABLET | Freq: Two times a day (BID) | ORAL | Status: DC
  Administered 2019-12-10 – 2019-12-14 (×8): 100 mg via ORAL
  Filled 2019-12-10 (×9): qty 1

## 2019-12-10 MED ORDER — CARVEDILOL 12.5 MG PO TABS: 12.5000 mg | ORAL_TABLET | Freq: Two times a day (BID) | ORAL | Status: DC

## 2019-12-10 MED ORDER — DOCUSATE SODIUM 100 MG PO CAPS
100.0000 mg | ORAL_CAPSULE | Freq: Every day | ORAL | Status: DC
  Administered 2019-12-10 – 2019-12-11 (×2): 100 mg via ORAL
  Filled 2019-12-10 (×4): qty 1

## 2019-12-10 MED ORDER — POLYETHYLENE GLYCOL 3350 17 G PO PACK
17.0000 g | PACK | Freq: Every day | ORAL | Status: DC
  Administered 2019-12-11: 17 g via ORAL
  Filled 2019-12-10 (×2): qty 1

## 2019-12-10 NOTE — Consult Note (Addendum)
Hospital Consult VASCULAR SURGERY ASSESSMENT & PLAN:   STAGE V CHRONIC KIDNEY DISEASE: This patient stage V chronic kidney disease and needs a tunneled dialysis catheter and permanent access.  Tomorrow schedule school so I have scheduled him for a right arm fistula or graft and tunneled dialysis catheter by Dr. Stanford Breed on Thursday.  His vein map is pending.  He has an A-line in the left radial artery and also looks like a PICC line on the left.  Therefore I would favor placing access in the right arm rather than having to move all this to the right arm.  I have discussed the indications for the procedure and potential complications with the patient and he is agreeable to proceed.  I will follow up on his vein map later today or tomorrow.  Deitra Mayo, MD Office: [phone]    Reason for Consult:  Permanent access/ [facility]  Requesting Physician:  Dr. Marval Regal MRN #:  [id number]  History of Present Illness: This is a 24 y.o. male who presented to [facility] ED with headache and blurry vision. He was sent from Opthalmologist office where he was found to have papilledema to both his eyes. He reports that he has history of high blood pressure and had not been taking his medication. In the ED he was found to have hypertensive emergency. He additionally has history of CKD and had not followed up with his Nephrologist in over 1 year. On initial labs in ED his Scr was found to be 10. Over past several days his SCr has continued to increase and Nephrology feels this is likely AKI/ CKD stage IV with progression to CKD V and likely requiring ongoing dialysis.  Patient has never had surgery before. No history of central lines or PICC lines. He does not have pacemaker or ICD. He has no upper extremity restrictions or previous surgeries. He is right hand dominant. Vascular surgery has been consulted to evaluate patient for permanent access placement and TDC.  Past Medical History:  Diagnosis Date  . Hypertension   .  Renal disorder     History reviewed. No pertinent surgical history.  Allergies  Allergen Reactions  . Other Swelling    Tree nuts    Prior to Admission medications   Medication Sig Start Date End Date Taking? Authorizing Provider  allopurinol (ZYLOPRIM) 300 MG tablet Take 300 mg by mouth daily.   Yes [provider]  hydrochlorothiazide (HYDRODIURIL) 25 MG tablet Take 12.5 mg by mouth daily.   Yes [provider]  lisinopril (PRINIVIL,ZESTRIL) 20 MG tablet Take 20 mg by mouth daily.   Yes [provider]    Social History   Socioeconomic History  . Marital status: Single    Spouse name: Not on file  . Number of children: Not on file  . Years of education: Not on file  . Highest education level: Not on file  Occupational History  . Not on file  Tobacco Use  . Smoking status: Never Smoker  . Smokeless tobacco: Never Used  Vaping Use  . Vaping Use: Never used  Substance and Sexual Activity  . Alcohol use: Yes    Comment: rarely  . Drug use: Never  . Sexual activity: Not on file  Other Topics Concern  . Not on file  Social History Narrative  . Not on file   Social Determinants of Health   Financial Resource Strain:   . Difficulty of Paying Living Expenses: Not on file  Food Insecurity:   .  Worried About Charity fundraiser in the Last Year: Not on file  . Ran Out of Food in the Last Year: Not on file  Transportation Needs:   . Lack of Transportation (Medical): Not on file  . Lack of Transportation (Non-Medical): Not on file  Physical Activity:   . Days of Exercise per Week: Not on file  . Minutes of Exercise per Session: Not on file  Stress:   . Feeling of Stress : Not on file  Social Connections:   . Frequency of Communication with Friends and Family: Not on file  . Frequency of Social Gatherings with Friends and Family: Not on file  . Attends Religious Services: Not on file  . Active Member of Clubs or Organizations: Not on file   . Attends Archivist Meetings: Not on file  . Marital Status: Not on file  Intimate Partner Violence:   . Fear of Current or Ex-Partner: Not on file  . Emotionally Abused: Not on file  . Physically Abused: Not on file  . Sexually Abused: Not on file    Family History  Problem Relation Age of Onset  . Prostate cancer Maternal Grandfather   . Hypertension Maternal Grandfather   . Hypertension Paternal Grandmother     ROS: Otherwise negative unless mentioned in HPI  Physical Examination  Vitals:   12/10/19 0700 12/10/19 0751  BP:    Pulse: (!) 102   Resp: (!) 21   Temp:  98.4 F (36.9 C)  SpO2: 95%    Body mass index is 34.22 kg/m.  General:  WDWN in NAD Gait: Not observed HENT: WNL, normocephalic Pulmonary: normal non-labored breathing Cardiac: regular rate and rhythm Abdomen: non distended Vascular Exam/Pulses: 2+ radial, ulnar and brachial pulses bilaterally. Left forearm and hand edematous. No edema of right forearm or hand. No tenderness. 5/5 grip strength bilaterally Musculoskeletal: no muscle wasting or atrophy  Neurologic: A&O X 3;  No focal weakness or paresthesias are detected; speech is fluent/normal Psychiatric:  The pt has Normal affect.   CBC    Component Value Date/Time   WBC 10.1 12/10/2019 0529   RBC 3.64 (L) 12/10/2019 0529   HGB 11.2 (L) 12/10/2019 0529   HCT 34.3 (L) 12/10/2019 0529   PLT 277 12/10/2019 0529   MCV 94.2 12/10/2019 0529   MCH 30.8 12/10/2019 0529   MCHC 32.7 12/10/2019 0529   RDW 13.9 12/10/2019 0529   LYMPHSABS 2.0 12/07/2019 1528   MONOABS 0.8 12/07/2019 1528   EOSABS 0.0 12/07/2019 1528   BASOSABS 0.1 12/07/2019 1528    BMET    Component Value Date/Time   NA 126 (L) 12/10/2019 0529   K 3.6 12/10/2019 0529   CL 96 (L) 12/10/2019 0529   CO2 20 (L) 12/10/2019 0529   GLUCOSE 106 (H) 12/10/2019 0529   BUN 59 (H) 12/10/2019 0529   CREATININE 11.05 (H) 12/10/2019 0529   CALCIUM 8.9 12/10/2019 0529    GFRNONAA 6 (L) 12/10/2019 0529   GFRAA 33 (L) 04/22/2017 0448    COAGS: No results found for: INR, PROTIME   Non-Invasive Vascular Imaging:   Pending bilateral upper extremity vein mapping  Statin:  No. Beta Blocker:  Yes.   Aspirin:  No. ACEI: No ARB:  No. CCB use:  Yes Other antiplatelets/anticoagulants:  No.    ASSESSMENT/PLAN: This is a 24 y.o. male who presented with hypertensive emergency and AKI/ CKD stage IV progressing to CKD V likely needing ongoing dialysis. Patient is right hand  dominant. Bilateral upper extremity vein mapping has been ordered. Need to restrict left upper extremity IV access and have RN remove IV from [facility] in left arm okay to leave distal IV. Will hopefully have adequate vein conduit for left upper extremity AV fistula. Will have further plan once vein mapping is complete. Discussed with patient and he expresses understanding. The on call vascular surgeon Dr. Scot Dock will evaluate patient later today to provide further surgical plans   Karoline Caldwell PA-C [facility] [phone] 12/10/2019  10:51 AM

## 2019-12-10 NOTE — Plan of Care (Signed)
Discussed with patient the basics of hemodialysis as an outpatient. And the importance of taking his medication as prescribed. Discussed that you can't always tell when your BP is elevated.

## 2019-12-10 NOTE — Progress Notes (Signed)
Patient ID: Robert Conner, male   DOB: 1995-12-06, 24 y.o.   MRN: [id number] S: No complaints and feels well. O:BP (!) 141/72   Pulse (!) 102   Temp 98.4 F (36.9 C) (Oral)   Resp (!) 21   Ht 5\' 6"  (1.676 m)   Wt 96.2 kg   SpO2 95%   BMI 34.22 kg/m   Intake/Output Summary (Last 24 hours) at 12/10/2019 0936 Last data filed at 12/10/2019 0900 Gross per 24 hour  Intake 2607.72 ml  Output 1825 ml  Net 782.72 ml   Intake/Output: I/O last 3 completed shifts: In: 2439.3 [P.O.:1920; I.V.:519.3] Out: 1925 [Urine:1925]  Intake/Output this shift:  Total I/O In: 302 [P.O.:250; I.V.:52] Out: -  Weight change:  Gen: NAD CVS: tachy at 102, no rub Resp: cta Abd: +BS, soft, NT/ND Ext: 1+ edema of left hand, no lower ext edema  Recent Labs  Lab 12/07/19 1528 12/08/19 0727 12/09/19 1046 12/10/19 0354 12/10/19 0529  NA 135 133* 135 128* 126*  K 4.6 3.4* 3.5 3.5 3.6  CL 98 100 101 99 96*  CO2 21* 20* 19* 19* 20*  GLUCOSE 94 101* 99 97 106*  BUN 61* 57* 60* 61* 59*  CREATININE 10.10* 10.08* 10.97* 11.22* 11.05*  ALBUMIN  --   --   --  3.2*  --   CALCIUM 9.5 8.7* 9.1 8.8* 8.9  PHOS  --   --   --  5.3*  --    Liver Function Tests: Recent Labs  Lab 12/10/19 0354  ALBUMIN 3.2*   No results for input(s): LIPASE, AMYLASE in the last 168 hours. No results for input(s): AMMONIA in the last 168 hours. CBC: Recent Labs  Lab 12/07/19 1528 12/07/19 1528 12/08/19 0727 12/09/19 1046 12/10/19 0529  WBC 13.2*   < > 13.8* 10.1 10.1  NEUTROABS 10.7*  --   --   --   --   HGB 14.0   < > 11.7* 11.1* 11.2*  HCT 40.3   < > 33.8* 33.6* 34.3*  MCV 90.2  --  89.9 92.8 94.2  PLT 171   < > 173 244 277   < > = values in this interval not displayed.   Cardiac Enzymes: No results for input(s): CKTOTAL, CKMB, CKMBINDEX, TROPONINI in the last 168 hours. CBG: Recent Labs  Lab 12/07/19 1710 12/08/19 0151  GLUCAP 123* 114*    Iron Studies: No results for input(s): IRON, TIBC, TRANSFERRIN,  FERRITIN in the last 72 hours. Studies/Results: VAS US RENAL ARTERY DUPLEX  Result Date: 12/09/2019 ABDOMINAL VISCERAL Indications: Severe resistant hypertension. Comparison Study: No prior. Performing Technologist: Oda Cogan RDMS, RVT  Examination Guidelines: A complete evaluation includes B-mode imaging, spectral Doppler, color Doppler, and power Doppler as needed of all accessible portions of each vessel. Bilateral testing is considered an integral part of a complete examination. Limited examinations for reoccurring indications may be performed as noted.  Duplex Findings: +--------------------+--------+--------+------+--------+ Mesenteric          PSV cm/sEDV cm/sPlaqueComments +--------------------+--------+--------+------+--------+ Aorta Prox             96                          +--------------------+--------+--------+------+--------+ Celiac Artery Origin  161                          +--------------------+--------+--------+------+--------+ SMA Proximal  245                          +--------------------+--------+--------+------+--------+    +------------------+--------+--------+-------+ Right Renal ArteryPSV cm/sEDV cm/sComment +------------------+--------+--------+-------+ Origin              128      75           +------------------+--------+--------+-------+ Proximal            115      22           +------------------+--------+--------+-------+ Mid                  68      17           +------------------+--------+--------+-------+ Distal               69      21           +------------------+--------+--------+-------+ +-----------------+--------+--------+-------+ Left Renal ArteryPSV cm/sEDV cm/sComment +-----------------+--------+--------+-------+ Origin              75      13           +-----------------+--------+--------+-------+ Proximal            83      16           +-----------------+--------+--------+-------+  Mid                 85      21           +-----------------+--------+--------+-------+ Distal              85      21           +-----------------+--------+--------+-------+ +------------+--------+--------+----+-----------+--------+--------+----+ Right KidneyPSV cm/sEDV cm/sRI  Left KidneyPSV cm/sEDV cm/sRI   +------------+--------+--------+----+-----------+--------+--------+----+ Upper Pole  18      7       0.60Upper Pole 65      9       0.80 +------------+--------+--------+----+-----------+--------+--------+----+ Mid         27      6       0.71Mid        27      10      0.62 +------------+--------+--------+----+-----------+--------+--------+----+ Lower Pole  24      6       0.73Lower Pole 31      9       0.71 +------------+--------+--------+----+-----------+--------+--------+----+ Hilar       29      8       0.72Hilar      37      11      0.71 +------------+--------+--------+----+-----------+--------+--------+----+ +------------------+----+------------------+----+ Right Kidney          Left Kidney            +------------------+----+------------------+----+ RAR                   RAR                    +------------------+----+------------------+----+ RAR (manual)          RAR (manual)           +------------------+----+------------------+----+ Cortex                Cortex                 +------------------+----+------------------+----+ Cortex thickness      Corex  thickness        +------------------+----+------------------+----+ Kidney length (cm)9.50Kidney length (cm)9.25 +------------------+----+------------------+----+  Summary: Renal:  Right: Patent renal artery without evidence of stenosis. Abnormal        right Resistive Index. Left:  Patent renal artery without evidence of stenosis. Abnormal        left Resisitve Index.        Echogenic kidneys bilaterally suggestive of medical renal        disease.  *See table(s) above  for measurements and observations.  Diagnosing physician: Ruta Hinds MD  Electronically signed by Ruta Hinds MD on 12/09/2019 at 3:54:38 PM.    Final    . amLODipine  10 mg Oral Daily  . Chlorhexidine Gluconate Cloth  6 each Topical Daily  . docusate sodium  100 mg Oral Daily  . heparin  5,000 Units Subcutaneous Q8H  . hydrALAZINE  100 mg Oral Q8H  . polyethylene glycol  17 g Oral Daily    BMET    Component Value Date/Time   NA 126 (L) 12/10/2019 0529   K 3.6 12/10/2019 0529   CL 96 (L) 12/10/2019 0529   CO2 20 (L) 12/10/2019 0529   GLUCOSE 106 (H) 12/10/2019 0529   BUN 59 (H) 12/10/2019 0529   CREATININE 11.05 (H) 12/10/2019 0529   CALCIUM 8.9 12/10/2019 0529   GFRNONAA 6 (L) 12/10/2019 0529   GFRAA 33 (L) 04/22/2017 0448   CBC    Component Value Date/Time   WBC 10.1 12/10/2019 0529   RBC 3.64 (L) 12/10/2019 0529   HGB 11.2 (L) 12/10/2019 0529   HCT 34.3 (L) 12/10/2019 0529   PLT 277 12/10/2019 0529   MCV 94.2 12/10/2019 0529   MCH 30.8 12/10/2019 0529   MCHC 32.7 12/10/2019 0529   RDW 13.9 12/10/2019 0529   LYMPHSABS 2.0 12/07/2019 1528   MONOABS 0.8 12/07/2019 1528   EOSABS 0.0 12/07/2019 1528   BASOSABS 0.1 12/07/2019 1528     Assessment/Plan:  1. 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  His Scr was 3.97 on 06/07/18.  None in the interim and likely represents progressive CKD.   1. Discussed the likelihood of requiring ongoing dialysis and he is amenable to start.   2. Will consult [facility] for AVF/AVG +/- TDC placement and initiate HD during this hospitalization.   3. No uremic symptoms at this time.  4. Discussed different modalities of RRT including incenter HD, home HD, and PD.  He is not interested in home therapies at this time.  5. Will also need outpatient referral for transplant evaluation once his BP is under better control. 2. Hypertensive emergency- presented with headache and  fatigue.  Imaging studies with possible atypical PRES.  BP markedly improved with clevidipine drip.  Also on amlodipine 10 mg daily, hydralazine 100 mg tid.  Lisinopril on hold due to AKI/CKD 1. BP overly corrected this morning as goal is SBP of 140-150.  Decrease clevidipine drip and hold po meds for SBP <140 for now.  2. Renal artery duplex without evidence of RAS, UDS negative 3. Continue with secondary HTN workup (renin, aldo, catecholamines and metanephrines pending) 3. Hyponatremia- improved 4. Metabolic acidosis- stable 5. Anemia of CKD- no indication for ESA with Hgb >10 at this time. 6. SHPTH- will order iPTH levels and follow Ca/phos. 7. Hypokalemia- stable.  Donetta Potts, MD [employer] [phone]

## 2019-12-10 NOTE — Progress Notes (Signed)
24hr urine collection started at 0331 on 12/10/19

## 2019-12-10 NOTE — Progress Notes (Signed)
NAME:  Robert Conner, MRN:  [id number], DOB:  1995-02-02, LOS: 2 ADMISSION DATE:  12/07/2019, CONSULTATION DATE:  12/08/2019 REFERRING MD:  Rupert Stacks, CHIEF COMPLAINT:  Hypertensive emergency   Brief History   Robert Conner is a 24 yo man with a PMHx of HTN who presented to [facility] ED on 12/07/19 due to headache and blurry vision found to have hypertensive emergency. He had been referred to the ED from the ophthalmology office where he was found to have papilledema in both eyes.   History of present illness   Robert Conner is a 25 yo man with a PMHx of HTN who presented to [facility] ED on 12/07/19 due to headache and blurry vision found to have hypertensive emergency. He had been referred to the ED from the ophthalmology office where he was found to have papilledema in both eyes.  Robert Conner had a 4 day history of headache and then developed blurry vision which prompted him to seek ophthalmology evaluation. He has a history of high blood pressure at baseline but has not been taking his blood pressure medications. (Per ED note, on amlodipine 10 mg daily, Lisinopril 20 mg, HCTZ 12.5 daily)  In the ED his blood pressure was initially 175/133 with highest recorded blood pressure of 256/172. He was started on a clevidipine drip. He had a head CT which was without acute findings. His creatinine was found to be elevated to 10 (baseline from 2019 of 3).  Past Medical History  HTN, not compliant with medications  Significant Hospital Events   Admitted 11/27 for hypertensive emergency  Consults:  Nephrology consult in the am  Procedures:  Plan for arterial line placement  Significant Diagnostic Tests:  MRI orbits  1. Diffuse abnormal T2/FLAIR signal abnormality throughout the brainstem and cerebellum, with extension into the left greater than right cerebral peduncles. Findings are nonspecific, with primary differential considerations including changes related to acute hypertensive  encephalopathy/atypical PRES or possibly toxic metabolic derangement.  2. Associated diffuse swelling and edema throughout the areas affected, with secondary crowding about the basilar cisterns, and partial effacement of the fourth ventricle. Slightly increased ventricular dilatation of the lateral and third ventricles suggesting secondary mild hydrocephalus. Thin rim of FLAIR hyperintensity about the lateral ventricles suspected to reflect a degree of transependymal flow of CSF. 3. Associated papilledema at the posterior globes, consistent with elevated ICP.  MRA Normal intracranial MRA  MRV: 1. No convincing findings to suggest dural venous thrombosis. 2. Focal signal loss at the posterior aspect of the superior sagittal sinus, which could reflect a focal stenosis. 3. Non visualization of the straight sinus and majority of the deep venous system, felt to be technical in artifactual nature on this exam due to patient positioning. Normal flow voids are seen within the partially visualized straight sinus, vein of Galen, and internal cerebral veins on corresponding brain MRI  CT head 12/07/19: No acute intracranial findings  Micro Data:  NA  Antimicrobials:  NA  Interim history/subjective:  No acute overnight events. Patient is awake and alert this morning states he is feeling well no longer having headache. He continues to have blurred vision, he denies headache, chest pain, SOB, nausea,   Objective   Blood pressure (!) 141/72, pulse (!) 102, temperature 98.4 F (36.9 C), temperature source Oral, resp. rate (!) 21, height 5\' 6"  (1.676 m), weight 96.2 kg, SpO2 95 %.        Intake/Output Summary (Last 24 hours) at 12/10/2019 1448 Last data filed  at 12/10/2019 0600 Gross per 24 hour  Intake 2279.72 ml  Output 1825 ml  Net 454.72 ml   Filed Weights   12/07/19 1438  Weight: 96.2 kg    Examination: Constitutional: young man in NAD  Eyes: EOMI, pupils equal, medial  deviation of right eye Ears, nose, mouth, and throat: MMM, trachea midline Cardiovascular: RRR, ext warm Respiratory: clear bilaterally, no accessory muscle use Gastrointestinal: soft, +BS Skin: No rashes, normal turgor Neurologic: Moves all 4 ext to command, good  strength Psychiatric:normal mood, cooperative  Resolved Hospital Problem list   NA  Assessment & Plan:  Hypertensive Emergency- BP decreasing on Cleviprex, hydralazine, and amlodipine.  - BP goal <120/80 today - Decrease clevidipine drip and start labetalol 100 mg twice daily - Continue amlodipine 10 mg daily and hydralazine 100 mg 3 times daily - No renal artery stenosis on [place], urine drug screen negative, TSH normal - pending urine metanephrines and catecholamines, aldosterone, rennin for secondary hypertension - Once BP is more stable will need to repeat fundus exam  CKD5- Renal function has not improved with normalizing BP. Cr. Up to 11.2 this morning Nephrology recommending starting HD during his admission.  - Consult to nephrology for end stage renal disease, appreciate reccomendations - Vascular surgery consult for dialysis access - Monitor renal function  Hyponatremia  Sodium 128 this morning in setting of worsening renal function. -Continue to monitor  Best practice (evaluated daily)  Diet: heart healthy Pain/Anxiety/Delirium protocol (if indicated): NA VAP protocol (if indicated): NA DVT prophylaxis: Patient mobile, no indication for pharmacologic prophylaxis GI prophylaxis: NA Glucose control: Will monitor sugars Mobility: Out of bed as tolerated last date of multidisciplinary goals of care discussion: NA Family and staff present: NA Summary of discussion: NA Follow up goals of care discussion due: NA Code Status: Full Disposition: ICU  Iona Beard PGY-1 Internal medicine 12/10/19 9:21 AM

## 2019-12-11 ENCOUNTER — Inpatient Hospital Stay (HOSPITAL_COMMUNITY): Payer: 59

## 2019-12-11 DIAGNOSIS — N186 End stage renal disease: Secondary | ICD-10-CM

## 2019-12-11 DIAGNOSIS — I161 Hypertensive emergency: Secondary | ICD-10-CM | POA: Diagnosis not present

## 2019-12-11 LAB — RENAL FUNCTION PANEL
Albumin: 3.1 g/dL — ABNORMAL LOW (ref 3.5–5.0)
Anion gap: 15 (ref 5–15)
BUN: 61 mg/dL — ABNORMAL HIGH (ref 6–20)
CO2: 20 mmol/L — ABNORMAL LOW (ref 22–32)
Calcium: 9.3 mg/dL (ref 8.9–10.3)
Chloride: 100 mmol/L (ref 98–111)
Creatinine, Ser: 11.19 mg/dL — ABNORMAL HIGH (ref 0.61–1.24)
GFR, Estimated: 6 mL/min — ABNORMAL LOW (ref >60)
Glucose, Bld: 106 mg/dL — ABNORMAL HIGH (ref 70–99)
Phosphorus: 7 mg/dL — ABNORMAL HIGH (ref 2.5–4.6)
Potassium: 4.2 mmol/L (ref 3.5–5.1)
Sodium: 135 mmol/L (ref 135–145)

## 2019-12-11 LAB — PARATHYROID HORMONE, INTACT (NO CA): PTH: 50 pg/mL (ref 15–65)

## 2019-12-11 MED ORDER — LANTHANUM CARBONATE 500 MG PO CHEW
1000.0000 mg | CHEWABLE_TABLET | Freq: Three times a day (TID) | ORAL | Status: DC
  Administered 2019-12-11 – 2019-12-14 (×5): 1000 mg via ORAL
  Filled 2019-12-11 (×6): qty 2

## 2019-12-11 MED ORDER — CEFAZOLIN SODIUM-DEXTROSE 1-4 GM/50ML-% IV SOLN
1.0000 g | INTRAVENOUS | Status: AC
  Administered 2019-12-12: 1 g via INTRAVENOUS
  Filled 2019-12-11: qty 50

## 2019-12-11 NOTE — Progress Notes (Signed)
Patient ID: Edilberto Roosevelt, male   DOB: Jun 04, 1995, 24 y.o.   MRN: [id number] S: feels better and reports improved vision (was seeing "double" until this morning). O:BP (!) 144/75   Pulse 94   Temp 98.8 F (37.1 C) (Oral)   Resp 18   Ht 5\' 6"  (1.676 m)   Wt 96.2 kg   SpO2 98%   BMI 34.22 kg/m   Intake/Output Summary (Last 24 hours) at 12/11/2019 1021 Last data filed at 12/11/2019 0900 Gross per 24 hour  Intake 671.05 ml  Output 1965 ml  Net -1293.95 ml   Intake/Output: I/O last 3 completed shifts: In: 1689.9 [P.O.:1050; I.V.:639.9] Out: 3390 [Urine:3390]  Intake/Output this shift:  Total I/O In: 17.7 [I.V.:17.7] Out: -  Weight change:  Gen: NAD CVS: RRR, no rub Resp: cta Abd: +BS, soft, NT/ND Ext: no edem  Recent Labs  Lab 12/07/19 1528 12/08/19 0727 12/09/19 1046 12/10/19 0354 12/10/19 0529 12/11/19 0622  NA 135 133* 135 128* 126* 135  K 4.6 3.4* 3.5 3.5 3.6 4.2  CL 98 100 101 99 96* 100  CO2 21* 20* 19* 19* 20* 20*  GLUCOSE 94 101* 99 97 106* 106*  BUN 61* 57* 60* 61* 59* 61*  CREATININE 10.10* 10.08* 10.97* 11.22* 11.05* 11.19*  ALBUMIN  --   --   --  3.2*  --  3.1*  CALCIUM 9.5 8.7* 9.1 8.8* 8.9 9.3  PHOS  --   --   --  5.3*  --  7.0*   Liver Function Tests: Recent Labs  Lab 12/10/19 0354 12/11/19 0622  ALBUMIN 3.2* 3.1*   No results for input(s): LIPASE, AMYLASE in the last 168 hours. No results for input(s): AMMONIA in the last 168 hours. CBC: Recent Labs  Lab 12/07/19 1528 12/07/19 1528 12/08/19 0727 12/09/19 1046 12/10/19 0529  WBC 13.2*   < > 13.8* 10.1 10.1  NEUTROABS 10.7*  --   --   --   --   HGB 14.0   < > 11.7* 11.1* 11.2*  HCT 40.3   < > 33.8* 33.6* 34.3*  MCV 90.2  --  89.9 92.8 94.2  PLT 171   < > 173 244 277   < > = values in this interval not displayed.   Cardiac Enzymes: No results for input(s): CKTOTAL, CKMB, CKMBINDEX, TROPONINI in the last 168 hours. CBG: Recent Labs  Lab 12/07/19 1710 12/08/19 0151  GLUCAP 123*  114*    Iron Studies: No results for input(s): IRON, TIBC, TRANSFERRIN, FERRITIN in the last 72 hours. Studies/Results: No results found. Marland Kitchen amLODipine  10 mg Oral Daily  . Chlorhexidine Gluconate Cloth  6 each Topical Daily  . docusate sodium  100 mg Oral Daily  . heparin  5,000 Units Subcutaneous Q8H  . hydrALAZINE  100 mg Oral Q8H  . labetalol  100 mg Oral BID  . polyethylene glycol  17 g Oral Daily    BMET    Component Value Date/Time   NA 135 12/11/2019 0622   K 4.2 12/11/2019 0622   CL 100 12/11/2019 0622   CO2 20 (L) 12/11/2019 0622   GLUCOSE 106 (H) 12/11/2019 0622   BUN 61 (H) 12/11/2019 0622   CREATININE 11.19 (H) 12/11/2019 0622   CALCIUM 9.3 12/11/2019 0622   GFRNONAA 6 (L) 12/11/2019 0622   GFRAA 33 (L) 04/22/2017 0448   CBC    Component Value Date/Time   WBC 10.1 12/10/2019 0529   RBC 3.64 (L) 12/10/2019 3818  HGB 11.2 (L) 12/10/2019 0529   HCT 34.3 (L) 12/10/2019 0529   PLT 277 12/10/2019 0529   MCV 94.2 12/10/2019 0529   MCH 30.8 12/10/2019 0529   MCHC 32.7 12/10/2019 0529   RDW 13.9 12/10/2019 0529   LYMPHSABS 2.0 12/07/2019 1528   MONOABS 0.8 12/07/2019 1528   EOSABS 0.0 12/07/2019 1528   BASOSABS 0.1 12/07/2019 1528    Assessment/Plan:  1. 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 His Scr was 3.97 on 06/07/18. None in the interim and likely represents progressive CKD.  1. Discussed the likelihood of requiring ongoing dialysis and he is amenable to start.  2. Appreciate Dr.Dickson's assistance with AVF/AVG and TDC placement for tomorrow. 3. Will initiate HD afterwards. 4. Will involve renal navigator to help with outpatient dialysis   5. No uremic symptoms at this time.  6. Discussed different modalities of RRT including incenter HD, home HD, and PD.  He is not interested in home therapies at this time. 2. Hypertensive emergency- presented with headache and fatigue. Imaging  studies with possible atypical PRES. BP markedly improved with clevidipine drip. Also on amlodipine 10 mg daily, hydralazine 100 mg tid. Lisinopril on hold due to AKI/CKD 1. BP's markedly improved 2. Renal artery duplex without evidence of RAS, UDS negative 3. Continue with secondary HTN workup (renin, aldo, catecholamines and metanephrines pending) 3. Hyponatremia- improved 4. Metabolic acidosis- stable 5. Anemia of CKD- no indication for ESA with Hgb >10 at this time. 6. SHPTH- hyperphosphatemia and will start binders. iPTH 50 no vit D for now.  7. Hypokalemia- stable.  Donetta Potts, MD [employer] [phone]

## 2019-12-11 NOTE — Progress Notes (Signed)
NAME:  Rosie Golson, MRN:  [id number], DOB:  April 22, 1995, LOS: 3 ADMISSION DATE:  12/07/2019, CONSULTATION DATE:  12/08/2019 REFERRING MD:  Rupert Stacks, CHIEF COMPLAINT:  Hypertensive emergency   Brief History   Mr. Atley is a 24 yo man with a PMHx of HTN who presented to [facility] ED on 12/07/19 due to headache and blurry vision found to have hypertensive emergency. He had been referred to the ED from the ophthalmology office where he was found to have papilledema in both eyes.   History of present illness   Mr. Noell is a 24 yo man with a PMHx of HTN who presented to [facility] ED on 12/07/19 due to headache and blurry vision found to have hypertensive emergency. He had been referred to the ED from the ophthalmology office where he was found to have papilledema in both eyes.  Mr. Denk had a 4 day history of headache and then developed blurry vision which prompted him to seek ophthalmology evaluation. He has a history of high blood pressure at baseline but has not been taking his blood pressure medications. (Per ED note, on amlodipine 10 mg daily, Lisinopril 20 mg, HCTZ 12.5 daily)  In the ED his blood pressure was initially 175/133 with highest recorded blood pressure of 256/172. He was started on a clevidipine drip. He had a head CT which was without acute findings. His creatinine was found to be elevated to 10 (baseline from 2019 of 3).  Past Medical History  HTN, not compliant with medications  Significant Hospital Events   Admitted 11/27 for hypertensive emergency  Consults:  Nephrology consult in the am  Procedures:  Plan for arterial line placement  Significant Diagnostic Tests:  MRI orbits  1. Diffuse abnormal T2/FLAIR signal abnormality throughout the brainstem and cerebellum, with extension into the left greater than right cerebral peduncles. Findings are nonspecific, with primary differential considerations including changes related to acute hypertensive  encephalopathy/atypical PRES or possibly toxic metabolic derangement.  2. Associated diffuse swelling and edema throughout the areas affected, with secondary crowding about the basilar cisterns, and partial effacement of the fourth ventricle. Slightly increased ventricular dilatation of the lateral and third ventricles suggesting secondary mild hydrocephalus. Thin rim of FLAIR hyperintensity about the lateral ventricles suspected to reflect a degree of transependymal flow of CSF. 3. Associated papilledema at the posterior globes, consistent with elevated ICP.  MRA Normal intracranial MRA  MRV: 1. No convincing findings to suggest dural venous thrombosis. 2. Focal signal loss at the posterior aspect of the superior sagittal sinus, which could reflect a focal stenosis. 3. Non visualization of the straight sinus and majority of the deep venous system, felt to be technical in artifactual nature on this exam due to patient positioning. Normal flow voids are seen within the partially visualized straight sinus, vein of Galen, and internal cerebral veins on corresponding brain MRI  CT head 12/07/19: No acute intracranial findings  Micro Data:  NA  Antimicrobials:  NA  Interim history/subjective:  No acute events overnight. Weaning down on cleviprex drip. Vision improving. No complaints this morning.   Objective   Blood pressure (!) 148/71, pulse 92, temperature 98.3 F (36.8 C), temperature source Oral, resp. rate 16, height 5\' 6"  (1.676 m), weight 96.2 kg, SpO2 94 %.        Intake/Output Summary (Last 24 hours) at 12/11/2019 1348 Last data filed at 12/11/2019 1200 Gross per 24 hour  Intake 428.01 ml  Output 1965 ml  Net -1536.99 ml  Filed Weights   12/07/19 1438  Weight: 96.2 kg    Examination: Constitutional: young male in NAD  Eyes: EOMI, pupils equal, medial deviation of right eye Ears, nose, mouth, and throat: MMM, trachea midline Cardiovascular: RRR, ext  warm Respiratory: clear bilaterally, no accessory muscle use Gastrointestinal: soft, +BS Skin: No rashes, normal turgor Neurologic: Moves all 4 ext to command, good  strength Psychiatric:normal mood, cooperative  Resolved Hospital Problem list   Hyponatremia  Assessment & Plan:  Hypertensive Emergency- BP decreasing on Cleviprex, hydralazine, and amlodipine.  - sBP 120-140, dBP 80-90 - Wean cleviprex drip off today - Continue amlodipine 10 mg daily, hydralazine 100 mg 3 times daily and labetalol 100mg  BID - No renal artery stenosis on [place], urine drug screen negative, TSH normal - pending urine metanephrines and catecholamines, aldosterone, rennin for secondary hypertension - Will need ophthalmology follow up once discharged  AKI/CKD stage IV -  Nephrology recommending starting HD during his admission.  -  Plan for vascular catheter placement tomorrow. NPO at midnight order has been placed.  Best practice (evaluated daily)  Diet: heart healthy Pain/Anxiety/Delirium protocol (if indicated): NA VAP protocol (if indicated): NA DVT prophylaxis: Patient mobile, no indication for pharmacologic prophylaxis GI prophylaxis: NA Glucose control: Will monitor sugars Mobility: Out of bed as tolerated last date of multidisciplinary goals of care discussion: NA Family and staff present: NA Summary of discussion: NA Follow up goals of care discussion due: NA Code Status: Full Disposition: Transfer out of ICU today once weaned off cleviprex  Freda Jackson, MD [place] Office: [phone]   See Amion for Pager Details

## 2019-12-11 NOTE — Progress Notes (Signed)
Upper extremity vein mapping study completed.   Please see CV Proc for preliminary results.   Darlin Coco, RDMS

## 2019-12-11 NOTE — Progress Notes (Addendum)
VASCULAR SURGERY:  The patient is scheduled for a right AV fistula or AV graft and tunneled dialysis catheter tomorrow.  His vein map is still pending.  Given that he had an A-line in the left arm and a PICC line I favored using the right arm.  Deitra Mayo, MD Office: [phone]    Pre op orders have been placed for right arm access for tomorrow by Dr. Stanford Breed.  Leontine Locket, [facility] 12/11/2019 6:58 AM

## 2019-12-12 ENCOUNTER — Inpatient Hospital Stay (HOSPITAL_COMMUNITY): Payer: 59 | Admitting: Anesthesiology

## 2019-12-12 ENCOUNTER — Inpatient Hospital Stay (HOSPITAL_COMMUNITY): Payer: 59

## 2019-12-12 ENCOUNTER — Encounter (HOSPITAL_COMMUNITY): Payer: Self-pay | Admitting: Internal Medicine

## 2019-12-12 ENCOUNTER — Encounter (HOSPITAL_COMMUNITY)
Admission: EM | Disposition: A | Discharge status: Home / Self Care | Payer: Self-pay | Source: Home / Self Care | Attending: Pulmonary Disease

## 2019-12-12 HISTORY — PX: INSERTION OF DIALYSIS CATHETER: SHX1324

## 2019-12-12 HISTORY — PX: AV FISTULA PLACEMENT: SHX1204

## 2019-12-12 LAB — CBC
HCT: 31 % — ABNORMAL LOW (ref 39.0–52.0)
Hemoglobin: 10.2 g/dL — ABNORMAL LOW (ref 13.0–17.0)
MCH: 30.9 pg (ref 26.0–34.0)
MCHC: 32.9 g/dL (ref 30.0–36.0)
MCV: 93.9 fL (ref 80.0–100.0)
Platelets: 329 10*3/uL (ref 150–400)
RBC: 3.3 MIL/uL — ABNORMAL LOW (ref 4.22–5.81)
RDW: 14 % (ref 11.5–15.5)
WBC: 7.7 10*3/uL (ref 4.0–10.5)
nRBC: 0 % (ref 0.0–0.2)

## 2019-12-12 LAB — RENAL FUNCTION PANEL
Albumin: 3 g/dL — ABNORMAL LOW (ref 3.5–5.0)
Anion gap: 15 (ref 5–15)
BUN: 63 mg/dL — ABNORMAL HIGH (ref 6–20)
CO2: 21 mmol/L — ABNORMAL LOW (ref 22–32)
Calcium: 9.1 mg/dL (ref 8.9–10.3)
Chloride: 100 mmol/L (ref 98–111)
Creatinine, Ser: 10.91 mg/dL — ABNORMAL HIGH (ref 0.61–1.24)
GFR, Estimated: 6 mL/min — ABNORMAL LOW (ref >60)
Glucose, Bld: 122 mg/dL — ABNORMAL HIGH (ref 70–99)
Phosphorus: 6.2 mg/dL — ABNORMAL HIGH (ref 2.5–4.6)
Potassium: 4.3 mmol/L (ref 3.5–5.1)
Sodium: 136 mmol/L (ref 135–145)

## 2019-12-12 LAB — SURGICAL PCR SCREEN
MRSA, PCR: NEGATIVE
Staphylococcus aureus: NEGATIVE

## 2019-12-12 SURGERY — ARTERIOVENOUS (AV) FISTULA CREATION
Anesthesia: General | Site: Neck | Laterality: Right

## 2019-12-12 MED ORDER — FENTANYL CITRATE (PF) 250 MCG/5ML IJ SOLN
INTRAMUSCULAR | Status: AC
  Filled 2019-12-12: qty 5

## 2019-12-12 MED ORDER — HEPARIN SODIUM (PORCINE) 1000 UNIT/ML IJ SOLN
INTRAMUSCULAR | Status: DC | PRN
  Administered 2019-12-12: 3.8 [IU] via INTRAVENOUS

## 2019-12-12 MED ORDER — MIDAZOLAM HCL 2 MG/2ML IJ SOLN
INTRAMUSCULAR | Status: DC | PRN
  Administered 2019-12-12: 1 mg via INTRAVENOUS

## 2019-12-12 MED ORDER — LIDOCAINE-EPINEPHRINE (PF) 1 %-1:200000 IJ SOLN
INTRAMUSCULAR | Status: AC
  Filled 2019-12-12: qty 30

## 2019-12-12 MED ORDER — EPHEDRINE SULFATE 50 MG/ML IJ SOLN
INTRAMUSCULAR | Status: DC | PRN
  Administered 2019-12-12 (×2): 10 mg via INTRAVENOUS
  Administered 2019-12-12: 5 mg via INTRAVENOUS

## 2019-12-12 MED ORDER — DEXAMETHASONE SODIUM PHOSPHATE 10 MG/ML IJ SOLN
INTRAMUSCULAR | Status: DC | PRN
  Administered 2019-12-12: 8 mg via INTRAVENOUS

## 2019-12-12 MED ORDER — LIDOCAINE HCL (CARDIAC) PF 100 MG/5ML IV SOSY
PREFILLED_SYRINGE | INTRAVENOUS | Status: DC | PRN
  Administered 2019-12-12: 60 mg via INTRAVENOUS

## 2019-12-12 MED ORDER — PHENYLEPHRINE 40 MCG/ML (10ML) SYRINGE FOR IV PUSH (FOR BLOOD PRESSURE SUPPORT)
PREFILLED_SYRINGE | INTRAVENOUS | Status: AC
  Filled 2019-12-12: qty 10

## 2019-12-12 MED ORDER — MIDAZOLAM HCL 2 MG/2ML IJ SOLN
INTRAMUSCULAR | Status: AC
  Filled 2019-12-12: qty 2

## 2019-12-12 MED ORDER — MUPIROCIN 2 % EX OINT: 1.0000 "application " | TOPICAL_OINTMENT | Freq: Two times a day (BID) | CUTANEOUS | Status: DC

## 2019-12-12 MED ORDER — SODIUM CHLORIDE 0.9 % IV SOLN: INTRAVENOUS | Status: DC

## 2019-12-12 MED ORDER — OXYCODONE HCL 5 MG PO TABS: 5.0000 mg | ORAL_TABLET | Freq: Once | ORAL | Status: DC | PRN

## 2019-12-12 MED ORDER — ONDANSETRON HCL 4 MG/2ML IJ SOLN
INTRAMUSCULAR | Status: DC | PRN
  Administered 2019-12-12: 4 mg via INTRAVENOUS

## 2019-12-12 MED ORDER — PNEUMOCOCCAL VAC POLYVALENT 25 MCG/0.5ML IJ INJ
0.5000 mL | INJECTION | INTRAMUSCULAR | Status: AC
  Administered 2019-12-14: 0.5 mL via INTRAMUSCULAR
  Filled 2019-12-12: qty 0.5

## 2019-12-12 MED ORDER — HEPARIN SODIUM (PORCINE) 1000 UNIT/ML IJ SOLN
INTRAMUSCULAR | Status: DC | PRN
  Administered 2019-12-12: 8000 [IU] via INTRAVENOUS

## 2019-12-12 MED ORDER — ONDANSETRON HCL 4 MG/2ML IJ SOLN
INTRAMUSCULAR | Status: AC
  Filled 2019-12-12: qty 2

## 2019-12-12 MED ORDER — PROPOFOL 10 MG/ML IV BOLUS
INTRAVENOUS | Status: DC | PRN
  Administered 2019-12-12: 200 mg via INTRAVENOUS

## 2019-12-12 MED ORDER — LACTATED RINGERS IV SOLN: INTRAVENOUS | Status: DC | PRN

## 2019-12-12 MED ORDER — PROPOFOL 10 MG/ML IV BOLUS
INTRAVENOUS | Status: AC
  Filled 2019-12-12: qty 20

## 2019-12-12 MED ORDER — INFLUENZA VAC SPLIT QUAD 0.5 ML IM SUSY
0.5000 mL | PREFILLED_SYRINGE | INTRAMUSCULAR | Status: AC
  Administered 2019-12-14: 0.5 mL via INTRAMUSCULAR
  Filled 2019-12-12: qty 0.5

## 2019-12-12 MED ORDER — EPHEDRINE 5 MG/ML INJ
INTRAVENOUS | Status: AC
  Filled 2019-12-12: qty 10

## 2019-12-12 MED ORDER — SODIUM CHLORIDE 0.9 % IV SOLN
INTRAVENOUS | Status: DC | PRN
  Administered 2019-12-12: 500 mL

## 2019-12-12 MED ORDER — ALBUMIN HUMAN 5 % IV SOLN: INTRAVENOUS | Status: DC | PRN

## 2019-12-12 MED ORDER — CHLORHEXIDINE GLUCONATE 0.12 % MT SOLN
OROMUCOSAL | Status: AC
  Administered 2019-12-12: 15 mL via OROMUCOSAL
  Filled 2019-12-12: qty 15

## 2019-12-12 MED ORDER — HEPARIN SODIUM (PORCINE) 1000 UNIT/ML IJ SOLN
INTRAMUSCULAR | Status: AC
  Filled 2019-12-12: qty 1

## 2019-12-12 MED ORDER — PROMETHAZINE HCL 25 MG/ML IJ SOLN: 6.2500 mg | INTRAMUSCULAR | Status: DC | PRN

## 2019-12-12 MED ORDER — SODIUM CHLORIDE 0.9 % IV SOLN: INTRAVENOUS | Status: DC | PRN

## 2019-12-12 MED ORDER — OXYCODONE HCL 5 MG/5ML PO SOLN: 5.0000 mg | Freq: Once | ORAL | Status: DC | PRN

## 2019-12-12 MED ORDER — FENTANYL CITRATE (PF) 100 MCG/2ML IJ SOLN: 25.0000 ug | INTRAMUSCULAR | Status: DC | PRN

## 2019-12-12 MED ORDER — PHENYLEPHRINE HCL-NACL 10-0.9 MG/250ML-% IV SOLN
INTRAVENOUS | Status: DC | PRN
  Administered 2019-12-12: 50 ug/min via INTRAVENOUS

## 2019-12-12 MED ORDER — SODIUM CHLORIDE 0.9 % IV SOLN
INTRAVENOUS | Status: AC
  Filled 2019-12-12: qty 1.2

## 2019-12-12 MED ORDER — CHLORHEXIDINE GLUCONATE 0.12 % MT SOLN
15.0000 mL | Freq: Once | OROMUCOSAL | Status: AC
  Filled 2019-12-12: qty 15

## 2019-12-12 MED ORDER — FENTANYL CITRATE (PF) 100 MCG/2ML IJ SOLN
INTRAMUSCULAR | Status: DC | PRN
  Administered 2019-12-12 (×2): 50 ug via INTRAVENOUS

## 2019-12-12 MED ORDER — PHENYLEPHRINE 40 MCG/ML (10ML) SYRINGE FOR IV PUSH (FOR BLOOD PRESSURE SUPPORT)
PREFILLED_SYRINGE | INTRAVENOUS | Status: DC | PRN
  Administered 2019-12-12 (×4): 120 ug via INTRAVENOUS

## 2019-12-12 MED ORDER — CHLORHEXIDINE GLUCONATE CLOTH 2 % EX PADS
6.0000 | MEDICATED_PAD | Freq: Every day | CUTANEOUS | Status: DC
  Administered 2019-12-13 – 2019-12-14 (×2): 6 via TOPICAL

## 2019-12-12 MED ORDER — 0.9 % SODIUM CHLORIDE (POUR BTL) OPTIME
TOPICAL | Status: DC | PRN
  Administered 2019-12-12: 1000 mL

## 2019-12-12 SURGICAL SUPPLY — 61 items
ARMBAND PINK RESTRICT EXTREMIT (MISCELLANEOUS) ×4 IMPLANT
BAG DECANTER FOR FLEXI CONT (MISCELLANEOUS) ×4 IMPLANT
BENZOIN TINCTURE PRP APPL 2/3 (GAUZE/BANDAGES/DRESSINGS) ×4 IMPLANT
BIOPATCH RED 1 DISK 7.0 (GAUZE/BANDAGES/DRESSINGS) ×3 IMPLANT
BIOPATCH RED 1IN DISK 7.0MM (GAUZE/BANDAGES/DRESSINGS) ×1
CANISTER SUCT 3000ML PPV (MISCELLANEOUS) ×4 IMPLANT
CANNULA VESSEL 3MM 2 BLNT TIP (CANNULA) ×4 IMPLANT
CATH PALINDROME-P 19CM W/VT (CATHETERS) IMPLANT
CATH PALINDROME-P 23CM W/VT (CATHETERS) ×4 IMPLANT
CATH PALINDROME-P 28CM W/VT (CATHETERS) IMPLANT
CHLORAPREP W/TINT 26 (MISCELLANEOUS) ×4 IMPLANT
CLIP VESOCCLUDE MED 6/CT (CLIP) ×4 IMPLANT
CLIP VESOCCLUDE SM WIDE 6/CT (CLIP) ×4 IMPLANT
CLOSURE WOUND 1/2 X4 (GAUZE/BANDAGES/DRESSINGS) ×1
COVER PROBE W GEL 5X96 (DRAPES) ×4 IMPLANT
COVER SURGICAL LIGHT HANDLE (MISCELLANEOUS) ×8 IMPLANT
COVER WAND RF STERILE (DRAPES) ×4 IMPLANT
DERMABOND ADVANCED (GAUZE/BANDAGES/DRESSINGS) ×4
DERMABOND ADVANCED .7 DNX12 (GAUZE/BANDAGES/DRESSINGS) ×4 IMPLANT
DRAPE C-ARM 42X72 X-RAY (DRAPES) ×4 IMPLANT
DRAPE CHEST BREAST 15X10 FENES (DRAPES) ×4 IMPLANT
DRAPE EXTREMITY T 121X128X90 (DISPOSABLE) ×4 IMPLANT
DRAPE ORTHO SPLIT 77X108 STRL (DRAPES) ×2
DRAPE SURG ORHT 6 SPLT 77X108 (DRAPES) ×2 IMPLANT
ELECT REM PT RETURN 9FT ADLT (ELECTROSURGICAL) ×4
ELECTRODE REM PT RTRN 9FT ADLT (ELECTROSURGICAL) ×2 IMPLANT
GAUZE 4X4 16PLY RFD (DISPOSABLE) ×4 IMPLANT
GAUZE SPONGE 4X4 12PLY STRL (GAUZE/BANDAGES/DRESSINGS) ×4 IMPLANT
GLOVE BIO SURGEON STRL SZ7.5 (GLOVE) ×4 IMPLANT
GLOVE SURG SS PI 8.0 STRL IVOR (GLOVE) ×8 IMPLANT
GOWN STRL REUS W/ TWL LRG LVL3 (GOWN DISPOSABLE) ×4 IMPLANT
GOWN STRL REUS W/ TWL XL LVL3 (GOWN DISPOSABLE) ×6 IMPLANT
GOWN STRL REUS W/TWL LRG LVL3 (GOWN DISPOSABLE) ×4
GOWN STRL REUS W/TWL XL LVL3 (GOWN DISPOSABLE) ×6
INSERT FOGARTY SM (MISCELLANEOUS) IMPLANT
KIT BASIN OR (CUSTOM PROCEDURE TRAY) ×4 IMPLANT
KIT PALINDROME-P 55CM (CATHETERS) IMPLANT
KIT TURNOVER KIT B (KITS) ×4 IMPLANT
NEEDLE 18GX1X1/2 (RX/OR ONLY) (NEEDLE) ×4 IMPLANT
NEEDLE HYPO 25GX1X1/2 BEV (NEEDLE) ×4 IMPLANT
NS IRRIG 1000ML POUR BTL (IV SOLUTION) ×4 IMPLANT
PACK CV ACCESS (CUSTOM PROCEDURE TRAY) ×4 IMPLANT
PACK SURGICAL SETUP 50X90 (CUSTOM PROCEDURE TRAY) ×4 IMPLANT
PAD ARMBOARD 7.5X6 YLW CONV (MISCELLANEOUS) ×8 IMPLANT
PENCIL SMOKE EVACUATOR (MISCELLANEOUS) ×4 IMPLANT
SET MICROPUNCTURE 5F STIFF (MISCELLANEOUS) ×8 IMPLANT
SOAP 2 % CHG 4 OZ (WOUND CARE) ×4 IMPLANT
STRIP CLOSURE SKIN 1/2X4 (GAUZE/BANDAGES/DRESSINGS) ×3 IMPLANT
SUT ETHILON 3 0 PS 1 (SUTURE) ×4 IMPLANT
SUT MNCRL AB 4-0 PS2 18 (SUTURE) ×4 IMPLANT
SUT PROLENE 6 0 BV (SUTURE) ×8 IMPLANT
SUT VIC AB 3-0 SH 27 (SUTURE) ×2
SUT VIC AB 3-0 SH 27X BRD (SUTURE) ×2 IMPLANT
SYR 10ML LL (SYRINGE) ×4 IMPLANT
SYR 20ML LL LF (SYRINGE) ×8 IMPLANT
SYR 5ML LL (SYRINGE) ×4 IMPLANT
SYR CONTROL 10ML LL (SYRINGE) ×4 IMPLANT
TOWEL GREEN STERILE (TOWEL DISPOSABLE) ×4 IMPLANT
TOWEL GREEN STERILE FF (TOWEL DISPOSABLE) ×8 IMPLANT
UNDERPAD 30X36 HEAVY ABSORB (UNDERPADS AND DIAPERS) ×4 IMPLANT
WATER STERILE IRR 1000ML POUR (IV SOLUTION) ×4 IMPLANT

## 2019-12-12 NOTE — Progress Notes (Addendum)
PROGRESS NOTE    Robert Conner  WCH:[id number] DOB: Dec 22, 1995 DOA: 12/07/2019 PCP: Patient, No Pcp Per    Brief Narrative:  This 24 years old male with past medical history of hypertension with noncompliance presented in the [facility] emergency room with complaints of headache and blurry vision,  found to have hypertensive emergency.  He was referred by ophthalmology office where he was found to have papilledema in both eyes. He was found to have blood pressure of 256/ 172 in the ED.  He was started on Cleviprex drip,  CT head was without any acute findings.  Serum creatinine was found to be elevated at 10 , His baseline creatinine in 2019 was 3.  He has not followed up with any primary care physician afterwards. Patient was admitted initially in the ICU requiring Cleviprex drip. Nephrology consulted recommended hemodialysis during this hospitalization. PCCM pickup 12/2> Cleviprex discontinued.  blood pressure improved.  Nephrology consulted vascular surgery,  possible AV fistula and tunneled catheter 12/2 followed by hemodialysis.  Assessment & Plan:   Principal Problem:   Hypertensive emergency Active Problems:   Papilledema   AKI (acute kidney injury) ([place])  Hypertensive Emergency- Resolved. He was found to have blood pressure 256 /172 in the ED.   He was referred by ophthalmologist after being found to have bilateral papilledema . Patient was started on Cleviprex , BP decreasing on Cleviprex, hydralazine, and amlodipine.  Blood pressure improved 145/85 He is off Cleviprex 12/1 Continue amlodipine 10 mg daily, hydralazine 100 mg 3 times daily and labetalol 100mg  BID No renal artery stenosis on [place], urine drug screen negative, TSH normal pending urine metanephrines and catecholamines, aldosterone, rennin for secondary hypertension He needs ophthalmology follow up once discharged  AKI/CKD stage IV -  Nephrology recommending starting HD during his admission.  -  Vascular surgery  consulted patient underwent right arm radiocephalic AV fistula, left internal jugular dialysis catheter. -Patient is starting hemodialysis tomorrow. -Case management working on  outpatient hemodialysis schedule.  DVT prophylaxis: Lovenox Code Status: Full code. Family Communication: Domestic partner was at bedside Disposition Plan: Status is: Inpatient  Remains inpatient appropriate because:Inpatient level of care appropriate due to severity of illness   Dispo: The patient is from: Home              Anticipated d/c is to: Home              Anticipated d/c date is: 3 days              Patient currently is not medically stable to d/c.   Consultants:   Nephrology  Vascular surgery  Procedures: AV fistula, tunneled hemodialysis catheter.  Antimicrobials:  Anti-infectives (From admission, onward)   Start     Dose/Rate Route Frequency Ordered Stop   12/12/19 0900  ceFAZolin (ANCEF) IVPB 1 g/50 mL premix       Note to Pharmacy: Send with pt to OR   1 g 100 mL/hr over 30 Minutes Intravenous On call 12/11/19 0657 12/12/19 1005      Subjective: Patient was seen and examined at bedside.  Overnight events noted.   Patient is s/p AV fistula and tunneled hemodialysis catheter 12/2.  Patient reports feeling better.  Objective: Vitals:   12/12/19 1221 12/12/19 1236 12/12/19 1251 12/12/19 1307  BP: 110/60  (!) 141/79 (!) 145/85  Pulse: 90 86 87 83  Resp: 20 20 20 13   Temp:    97.8 F (36.6 C)  TempSrc:  SpO2: 95% 98% 97% 99%  Weight:      Height:        Intake/Output Summary (Last 24 hours) at 12/12/2019 1449 Last data filed at 12/12/2019 1157 Gross per 24 hour  Intake 858.28 ml  Output 15 ml  Net 843.28 ml   Filed Weights   12/07/19 1438 12/12/19 0443  Weight: 96.2 kg 101.5 kg    Examination:  General exam: Appears calm and comfortable , not in any distress Respiratory system: Clear to auscultation. Respiratory effort normal. Cardiovascular system: S1 & S2  heard, RRR. No JVD, murmurs, rubs, gallops or clicks. No pedal edema. Gastrointestinal system: Abdomen is nondistended, soft and nontender. No organomegaly or masses felt. Normal bowel sounds heard. Central nervous system: Alert and oriented. No focal neurological deficits. Extremities: Right arm AV fistula.  Left internal jugular catheter. Skin: No rashes, lesions or ulcers Psychiatry: Judgement and insight appear normal. Mood & affect appropriate.     Data Reviewed: I have personally reviewed following labs and imaging studies  CBC: Recent Labs  Lab 12/07/19 1528 12/08/19 0727 12/09/19 1046 12/10/19 0529 12/12/19 0054  WBC 13.2* 13.8* 10.1 10.1 7.7  NEUTROABS 10.7*  --   --   --   --   HGB 14.0 11.7* 11.1* 11.2* 10.2*  HCT 40.3 33.8* 33.6* 34.3* 31.0*  MCV 90.2 89.9 92.8 94.2 93.9  PLT 171 173 244 277 878   Basic Metabolic Panel: Recent Labs  Lab 12/09/19 1046 12/10/19 0354 12/10/19 0529 12/11/19 0622 12/12/19 0054  NA 135 128* 126* 135 136  K 3.5 3.5 3.6 4.2 4.3  CL 101 99 96* 100 100  CO2 19* 19* 20* 20* 21*  GLUCOSE 99 97 106* 106* 122*  BUN 60* 61* 59* 61* 63*  CREATININE 10.97* 11.22* 11.05* 11.19* 10.91*  CALCIUM 9.1 8.8* 8.9 9.3 9.1  PHOS  --  5.3*  --  7.0* 6.2*   GFR: Estimated Creatinine Clearance: 11.8 mL/min (A) (by C-G formula based on SCr of 10.91 mg/dL (H)). Liver Function Tests: Recent Labs  Lab 12/10/19 0354 12/11/19 0622 12/12/19 0054  ALBUMIN 3.2* 3.1* 3.0*   No results for input(s): LIPASE, AMYLASE in the last 168 hours. No results for input(s): AMMONIA in the last 168 hours. Coagulation Profile: No results for input(s): INR, PROTIME in the last 168 hours. Cardiac Enzymes: No results for input(s): CKTOTAL, CKMB, CKMBINDEX, TROPONINI in the last 168 hours. BNP (last 3 results) No results for input(s): PROBNP in the last 8760 hours. HbA1C: No results for input(s): HGBA1C in the last 72 hours. CBG: Recent Labs  Lab 12/07/19 1710  12/08/19 0151  GLUCAP 123* 114*   Lipid Profile: Recent Labs    12/10/19 0353  TRIG 178*   Thyroid Function Tests: No results for input(s): TSH, T4TOTAL, FREET4, T3FREE, THYROIDAB in the last 72 hours. Anemia Panel: No results for input(s): VITAMINB12, FOLATE, FERRITIN, TIBC, IRON, RETICCTPCT in the last 72 hours. Sepsis Labs: No results for input(s): PROCALCITON, LATICACIDVEN in the last 168 hours.  Recent Results (from the past 240 hour(s))  Resp Panel by RT-PCR (Flu A&B, Covid) Nasopharyngeal Swab     Status: None   Collection Time: 12/07/19  7:45 PM   Specimen: Nasopharyngeal Swab; Nasopharyngeal(NP) swabs in vial transport medium  Result Value Ref Range Status   SARS Coronavirus 2 by RT PCR NEGATIVE NEGATIVE Final    Comment: (NOTE) SARS-CoV-2 target nucleic acids are NOT DETECTED.  The SARS-CoV-2 RNA is generally detectable in upper respiratory  specimens during the acute phase of infection. The lowest concentration of SARS-CoV-2 viral copies this assay can detect is 138 copies/mL. A negative result does not preclude SARS-Cov-2 infection and should not be used as the sole basis for treatment or other patient management decisions. A negative result may occur with  improper specimen collection/handling, submission of specimen other than nasopharyngeal swab, presence of viral mutation(s) within the areas targeted by this assay, and inadequate number of viral copies(<138 copies/mL). A negative result must be combined with clinical observations, patient history, and epidemiological information. The expected result is Negative.  Fact Sheet for Patients:  EntrepreneurPulse.com.au  Fact Sheet for Healthcare Providers:  IncredibleEmployment.be  This test is no t yet approved or cleared by the [place] FDA and  has been authorized for detection and/or diagnosis of SARS-CoV-2 by FDA under an Emergency Use Authorization (EUA). This EUA  will remain  in effect (meaning this test can be used) for the duration of the COVID-19 declaration under Section 564(b)(1) of the Act, 21 U.S.C.section 360bbb-3(b)(1), unless the authorization is terminated  or revoked sooner.       Influenza A by PCR NEGATIVE NEGATIVE Final   Influenza B by PCR NEGATIVE NEGATIVE Final    Comment: (NOTE) The Xpert Xpress SARS-CoV-2/FLU/RSV plus assay is intended as an aid in the diagnosis of influenza from Nasopharyngeal swab specimens and should not be used as a sole basis for treatment. Nasal washings and aspirates are unacceptable for Xpert Xpress SARS-CoV-2/FLU/RSV testing.  Fact Sheet for Patients: EntrepreneurPulse.com.au  Fact Sheet for Healthcare Providers: IncredibleEmployment.be  This test is not yet approved or cleared by the [place] FDA and has been authorized for detection and/or diagnosis of SARS-CoV-2 by FDA under an Emergency Use Authorization (EUA). This EUA will remain in effect (meaning this test can be used) for the duration of the COVID-19 declaration under Section 564(b)(1) of the Act, 21 U.S.C. section 360bbb-3(b)(1), unless the authorization is terminated or revoked.  Performed at [facility], [place] [address], [place], [place] [address]   MRSA PCR Screening     Status: None   Collection Time: 12/08/19  1:50 AM   Specimen: Nasopharyngeal  Result Value Ref Range Status   MRSA by PCR NEGATIVE NEGATIVE Final    Comment:        The GeneXpert MRSA Assay (FDA approved for NASAL specimens only), is one component of a comprehensive MRSA colonization surveillance program. It is not intended to diagnose MRSA infection nor to guide or monitor treatment for MRSA infections. Performed at [facility], [place] [address], [place], [place] [address]   Surgical PCR screen     Status: None   Collection Time: 12/12/19  2:56 AM   Specimen: Nasal Mucosa; Nasal Swab   Result Value Ref Range Status   MRSA, PCR NEGATIVE NEGATIVE Final   Staphylococcus aureus NEGATIVE NEGATIVE Final    Comment: (NOTE) The Xpert SA Assay (FDA approved for NASAL specimens in patients 18 years of age and older), is one component of a comprehensive surveillance program. It is not intended to diagnose infection nor to guide or monitor treatment. Performed at [facility], [place] [address], [place], [place] [address]          Radiology Studies: DG CHEST PORT 1 VIEW  Result Date: 12/12/2019 CLINICAL DATA:  Central catheter placement EXAM: PORTABLE CHEST 1 VIEW COMPARISON:  April 21, 2017 FINDINGS: Central catheter tip is at the cavoatrial junction. No pneumothorax. Lungs  are clear. Heart is mildly enlarged with pulmonary vascularity normal. No adenopathy. No bone lesions. IMPRESSION: Central catheter tip at cavoatrial junction. No pneumothorax. Mild cardiomegaly. Lungs clear. Electronically Signed   By: Lowella Grip III M.D.   On: 12/12/2019 12:21   DG Fluoro Guide CV Line-No Report  Result Date: 12/12/2019 Fluoroscopy was utilized by the requesting physician.  No radiographic interpretation.   VAS [place] UPPER EXT VEIN MAPPING (PRE-OP AVF)  Result Date: 12/11/2019 UPPER EXTREMITY VEIN MAPPING  Indications: Pre-access. History: End stage renal disease.  Limitations: Bandaging- PICC line LT Comparison Study: No prior studies. Performing Technologist: Darlin Coco, RDMS  Examination Guidelines: A complete evaluation includes B-mode imaging, spectral Doppler, color Doppler, and power Doppler as needed of all accessible portions of each vessel. Bilateral testing is considered an integral part of a complete examination. Limited examinations for reoccurring indications may be performed as noted. +-----------------+-------------+----------+---------+ Right Cephalic   Diameter (cm)Depth (cm)Findings  +-----------------+-------------+----------+---------+ Shoulder              0.52        0.45             +-----------------+-------------+----------+---------+ Prox upper arm       0.54        0.83   branching +-----------------+-------------+----------+---------+ Mid upper arm        0.45        0.30             +-----------------+-------------+----------+---------+ Dist upper arm       0.57        0.47             +-----------------+-------------+----------+---------+ Antecubital fossa    0.68        0.41             +-----------------+-------------+----------+---------+ Prox forearm         0.44        0.47             +-----------------+-------------+----------+---------+ Mid forearm          0.40        0.41             +-----------------+-------------+----------+---------+ Dist forearm         0.38        0.29             +-----------------+-------------+----------+---------+ Wrist                0.36        0.29             +-----------------+-------------+----------+---------+ +-----------------+-------------+----------+---------+ Right Basilic    Diameter (cm)Depth (cm)Findings  +-----------------+-------------+----------+---------+ Mid upper arm        0.59                         +-----------------+-------------+----------+---------+ Dist upper arm       0.62               branching +-----------------+-------------+----------+---------+ Antecubital fossa    0.52               branching +-----------------+-------------+----------+---------+ Prox forearm         0.40               branching +-----------------+-------------+----------+---------+ Mid forearm          0.37                         +-----------------+-------------+----------+---------+  Distal forearm       0.38                         +-----------------+-------------+----------+---------+ Wrist                0.24               branching +-----------------+-------------+----------+---------+  +-----------------+-------------+----------+--------------+ Left Cephalic    Diameter (cm)Depth (cm)   Findings    +-----------------+-------------+----------+--------------+ Shoulder             0.43        0.95                  +-----------------+-------------+----------+--------------+ Prox upper arm       0.37        0.98                  +-----------------+-------------+----------+--------------+ Mid upper arm        0.38        0.40                  +-----------------+-------------+----------+--------------+ Dist upper arm       0.43        0.42                  +-----------------+-------------+----------+--------------+ Antecubital fossa                       not visualized +-----------------+-------------+----------+--------------+ Prox forearm                            not visualized +-----------------+-------------+----------+--------------+ Mid forearm          0.39        0.59                  +-----------------+-------------+----------+--------------+ Dist forearm         0.42        0.42                  +-----------------+-------------+----------+--------------+ Wrist                                   not visualized +-----------------+-------------+----------+--------------+ *See table(s) above for measurements and observations.  Diagnosing physician: Harold Barban MD Electronically signed by Harold Barban MD on 12/11/2019 at 8:55:33 PM.    Final    Scheduled Meds: . amLODipine  10 mg Oral Daily  . [START ON 12/13/2019] Chlorhexidine Gluconate Cloth  6 each Topical Q0600  . docusate sodium  100 mg Oral Daily  . heparin  5,000 Units Subcutaneous Q8H  . hydrALAZINE  100 mg Oral Q8H  . [START ON 12/14/2019] influenza vac split quadrivalent PF  0.5 mL Intramuscular Tomorrow-1000  . labetalol  100 mg Oral BID  . lanthanum  1,000 mg Oral TID WC  . mupirocin ointment  1 application Nasal BID  . [START ON 12/14/2019] pneumococcal 23 valent vaccine   0.5 mL Intramuscular Tomorrow-1000  . polyethylene glycol  17 g Oral Daily   Continuous Infusions: . sodium chloride    . sodium chloride 10 mL/hr at 12/12/19 0849  . clevidipine Stopped (12/11/19 1610)     LOS: 4 days    Time spent: 35 mins    Algis Lehenbauer, MD [facility]   If 7PM-7AM, please contact  night-coverage

## 2019-12-12 NOTE — Transfer of Care (Signed)
Immediate Anesthesia Transfer of Care Note  Patient: Robert Conner  Procedure(s) Performed: Creation of RIGHT  ARM Radial-cephalic  ARTERIOVENOUS (AV) FISTULA . (Right Arm Lower) INSERTION OF Left Internal Jugular  DIALYSIS CATHETER. (Left Neck)  Patient Location: PACU  Anesthesia Type:General  Level of Consciousness: awake  Airway & Oxygen Therapy: Patient Spontanous Breathing and Patient connected to nasal cannula oxygen  Post-op Assessment: Report given to RN and Post -op Vital signs reviewed and stable  Post vital signs: Reviewed and stable  Last Vitals:  Vitals Value Taken Time  BP 134/74 12/12/19 1206  Temp    Pulse 99 12/12/19 1208  Resp 36 12/12/19 1208  SpO2 96 % 12/12/19 1208  Vitals shown include unvalidated device data.  Last Pain:  Vitals:   12/12/19 0815  TempSrc:   PainSc: 0-No pain      Patients Stated Pain Goal: 0 (36/68/15 9470)  Complications: No complications documented.

## 2019-12-12 NOTE — Progress Notes (Signed)
Patient ID: Robert Conner, male   DOB: 08/09/1995, 24 y.o.   MRN: [id number] S: Doing well s/p right Cimino AVF creation and LIJ TDC placement by Dr. Stanford Breed O:BP (!) 145/85   Pulse 83   Temp 97.8 F (36.6 C)   Resp 13   Ht 5\' 6"  (1.676 m)   Wt 101.5 kg   SpO2 99%   BMI 36.12 kg/m   Intake/Output Summary (Last 24 hours) at 12/12/2019 1421 Last data filed at 12/12/2019 1157 Gross per 24 hour  Intake 858.28 ml  Output 15 ml  Net 843.28 ml   Intake/Output: I/O last 3 completed shifts: In: 217.5 [I.V.:217.5] Out: 1000 [Urine:1000]  Intake/Output this shift:  Total I/O In: 850 [I.V.:550; IV Piggyback:300] Out: 15 [Blood:15] Weight change:  Gen: NAD CVS: RRR, no rub Resp: cta Abd: +BS, soft, NT/ND Ext: no edema, R AVF +T/B  Recent Labs  Lab 12/07/19 1528 12/08/19 0727 12/09/19 1046 12/10/19 0354 12/10/19 0529 12/11/19 0622 12/12/19 0054  NA 135 133* 135 128* 126* 135 136  K 4.6 3.4* 3.5 3.5 3.6 4.2 4.3  CL 98 100 101 99 96* 100 100  CO2 21* 20* 19* 19* 20* 20* 21*  GLUCOSE 94 101* 99 97 106* 106* 122*  BUN 61* 57* 60* 61* 59* 61* 63*  CREATININE 10.10* 10.08* 10.97* 11.22* 11.05* 11.19* 10.91*  ALBUMIN  --   --   --  3.2*  --  3.1* 3.0*  CALCIUM 9.5 8.7* 9.1 8.8* 8.9 9.3 9.1  PHOS  --   --   --  5.3*  --  7.0* 6.2*   Liver Function Tests: Recent Labs  Lab 12/10/19 0354 12/11/19 0622 12/12/19 0054  ALBUMIN 3.2* 3.1* 3.0*   No results for input(s): LIPASE, AMYLASE in the last 168 hours. No results for input(s): AMMONIA in the last 168 hours. CBC: Recent Labs  Lab 12/07/19 1528 12/07/19 1528 12/08/19 0727 12/08/19 0727 12/09/19 1046 12/10/19 0529 12/12/19 0054  WBC 13.2*   < > 13.8*   < > 10.1 10.1 7.7  NEUTROABS 10.7*  --   --   --   --   --   --   HGB 14.0   < > 11.7*   < > 11.1* 11.2* 10.2*  HCT 40.3   < > 33.8*   < > 33.6* 34.3* 31.0*  MCV 90.2  --  89.9  --  92.8 94.2 93.9  PLT 171   < > 173   < > 244 277 329   < > = values in this interval not  displayed.   Cardiac Enzymes: No results for input(s): CKTOTAL, CKMB, CKMBINDEX, TROPONINI in the last 168 hours. CBG: Recent Labs  Lab 12/07/19 1710 12/08/19 0151  GLUCAP 123* 114*    Iron Studies: No results for input(s): IRON, TIBC, TRANSFERRIN, FERRITIN in the last 72 hours. Studies/Results: DG CHEST PORT 1 VIEW  Result Date: 12/12/2019 CLINICAL DATA:  Central catheter placement EXAM: PORTABLE CHEST 1 VIEW COMPARISON:  April 21, 2017 FINDINGS: Central catheter tip is at the cavoatrial junction. No pneumothorax. Lungs are clear. Heart is mildly enlarged with pulmonary vascularity normal. No adenopathy. No bone lesions. IMPRESSION: Central catheter tip at cavoatrial junction. No pneumothorax. Mild cardiomegaly. Lungs clear. Electronically Signed   By: Lowella Grip III M.D.   On: 12/12/2019 12:21   DG Fluoro Guide CV Line-No Report  Result Date: 12/12/2019 Fluoroscopy was utilized by the requesting physician.  No radiographic interpretation.   VAS [place]  UPPER EXT VEIN MAPPING (PRE-OP AVF)  Result Date: 12/11/2019 UPPER EXTREMITY VEIN MAPPING  Indications: Pre-access. History: End stage renal disease.  Limitations: Bandaging- PICC line LT Comparison Study: No prior studies. Performing Technologist: Darlin Coco, RDMS  Examination Guidelines: A complete evaluation includes B-mode imaging, spectral Doppler, color Doppler, and power Doppler as needed of all accessible portions of each vessel. Bilateral testing is considered an integral part of a complete examination. Limited examinations for reoccurring indications may be performed as noted. +-----------------+-------------+----------+---------+ Right Cephalic   Diameter (cm)Depth (cm)Findings  +-----------------+-------------+----------+---------+ Shoulder             0.52        0.45             +-----------------+-------------+----------+---------+ Prox upper arm       0.54        0.83   branching  +-----------------+-------------+----------+---------+ Mid upper arm        0.45        0.30             +-----------------+-------------+----------+---------+ Dist upper arm       0.57        0.47             +-----------------+-------------+----------+---------+ Antecubital fossa    0.68        0.41             +-----------------+-------------+----------+---------+ Prox forearm         0.44        0.47             +-----------------+-------------+----------+---------+ Mid forearm          0.40        0.41             +-----------------+-------------+----------+---------+ Dist forearm         0.38        0.29             +-----------------+-------------+----------+---------+ Wrist                0.36        0.29             +-----------------+-------------+----------+---------+ +-----------------+-------------+----------+---------+ Right Basilic    Diameter (cm)Depth (cm)Findings  +-----------------+-------------+----------+---------+ Mid upper arm        0.59                         +-----------------+-------------+----------+---------+ Dist upper arm       0.62               branching +-----------------+-------------+----------+---------+ Antecubital fossa    0.52               branching +-----------------+-------------+----------+---------+ Prox forearm         0.40               branching +-----------------+-------------+----------+---------+ Mid forearm          0.37                         +-----------------+-------------+----------+---------+ Distal forearm       0.38                         +-----------------+-------------+----------+---------+ Wrist                0.24  branching +-----------------+-------------+----------+---------+ +-----------------+-------------+----------+--------------+ Left Cephalic    Diameter (cm)Depth (cm)   Findings    +-----------------+-------------+----------+--------------+ Shoulder              0.43        0.95                  +-----------------+-------------+----------+--------------+ Prox upper arm       0.37        0.98                  +-----------------+-------------+----------+--------------+ Mid upper arm        0.38        0.40                  +-----------------+-------------+----------+--------------+ Dist upper arm       0.43        0.42                  +-----------------+-------------+----------+--------------+ Antecubital fossa                       not visualized +-----------------+-------------+----------+--------------+ Prox forearm                            not visualized +-----------------+-------------+----------+--------------+ Mid forearm          0.39        0.59                  +-----------------+-------------+----------+--------------+ Dist forearm         0.42        0.42                  +-----------------+-------------+----------+--------------+ Wrist                                   not visualized +-----------------+-------------+----------+--------------+ *See table(s) above for measurements and observations.  Diagnosing physician: Harold Barban MD Electronically signed by Harold Barban MD on 12/11/2019 at 8:55:33 PM.    Final    . amLODipine  10 mg Oral Daily  . Chlorhexidine Gluconate Cloth  6 each Topical Daily  . [START ON 12/13/2019] Chlorhexidine Gluconate Cloth  6 each Topical Q0600  . docusate sodium  100 mg Oral Daily  . heparin  5,000 Units Subcutaneous Q8H  . hydrALAZINE  100 mg Oral Q8H  . [START ON 12/14/2019] influenza vac split quadrivalent PF  0.5 mL Intramuscular Tomorrow-1000  . labetalol  100 mg Oral BID  . lanthanum  1,000 mg Oral TID WC  . mupirocin ointment  1 application Nasal BID  . [START ON 12/14/2019] pneumococcal 23 valent vaccine  0.5 mL Intramuscular Tomorrow-1000  . polyethylene glycol  17 g Oral Daily    BMET    Component Value Date/Time   NA 136 12/12/2019 0054   K 4.3  12/12/2019 0054   CL 100 12/12/2019 0054   CO2 21 (L) 12/12/2019 0054   GLUCOSE 122 (H) 12/12/2019 0054   BUN 63 (H) 12/12/2019 0054   CREATININE 10.91 (H) 12/12/2019 0054   CALCIUM 9.1 12/12/2019 0054   GFRNONAA 6 (L) 12/12/2019 0054   GFRAA 33 (L) 04/22/2017 0448   CBC    Component Value Date/Time   WBC 7.7 12/12/2019 0054   RBC 3.30 (L) 12/12/2019 0054   HGB 10.2 (L) 12/12/2019 0054   HCT 31.0 (  L) 12/12/2019 0054   PLT 329 12/12/2019 0054   MCV 93.9 12/12/2019 0054   MCH 30.9 12/12/2019 0054   MCHC 32.9 12/12/2019 0054   RDW 14.0 12/12/2019 0054   LYMPHSABS 2.0 12/07/2019 1528   MONOABS 0.8 12/07/2019 1528   EOSABS 0.0 12/07/2019 1528   BASOSABS 0.1 12/07/2019 1528    Assessment/Plan:  1. 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 His Scr was 3.97 on 06/07/18. None in the interim and likely represents progressive CKD.  1. Discussed the likelihood of requiring ongoing dialysisand he is amenable to start.  2. S/p R forearm AVF creation and LIJ TDC placement by Dr. Stanford Breed today. 3. Will initiate HD tomorrow. 4. Will involve renal navigator to help with outpatient dialysis   5. No uremic symptoms at this time. 6. Discussed different modalities of RRT including incenter HD, home HD, and PD. He is not interested in home therapies at this time. 2. Hypertensive emergency- presented with headache and fatigue. Imaging studies with possible atypical PRES. BP markedly improved with clevidipine drip. Also on amlodipine 10 mg daily, hydralazine 100 mg tid. Lisinopril on hold due to AKI/CKD 1. BP's markedly improved 2. Renal artery duplex without evidence of RAS, UDS negative 3. Continue with secondary HTN workup (renin, aldo, catecholamines and metanephrines pending) 3. Hyponatremia- improved 4. Metabolic acidosis- stable 5. Anemia of CKD- no indication for ESA with Hgb >10 at this time. 6. SHPTH-  hyperphosphatemia and will start binders. iPTH 50 no vit D for now.  7. Hypokalemia- stable.  Donetta Potts, MD [employer] [phone]

## 2019-12-12 NOTE — Anesthesia Preprocedure Evaluation (Addendum)
Anesthesia Evaluation  Patient identified by MRN, date of birth, ID band Patient awake    Reviewed: Allergy & Precautions, NPO status , Patient's Chart, lab work & pertinent test results  History of Anesthesia Complications Negative for: history of anesthetic complications  Airway Mallampati: I  TM Distance: >3 FB Neck ROM: Full    Dental  (+) Dental Advisory Given, Teeth Intact   Pulmonary neg pulmonary ROS,    Pulmonary exam normal        Cardiovascular hypertension, Pt. on medications Normal cardiovascular exam     Neuro/Psych negative neurological ROS  negative psych ROS   GI/Hepatic negative GI ROS, Neg liver ROS,   Endo/Other   Obesity   Renal/GU ESRFRenal disease     Musculoskeletal negative musculoskeletal ROS (+)   Abdominal   Peds  Hematology  (+) anemia ,   Anesthesia Other Findings Covid test negative   Reproductive/Obstetrics                            Anesthesia Physical Anesthesia Plan  ASA: IV  Anesthesia Plan: General   Post-op Pain Management:    Induction: Intravenous  PONV Risk Score and Plan: 2 and Treatment may vary due to age or medical condition, Ondansetron and Midazolam  Airway Management Planned: LMA  Additional Equipment: None  Intra-op Plan:   Post-operative Plan: Extubation in OR  Informed Consent: I have reviewed the patients History and Physical, chart, labs and discussed the procedure including the risks, benefits and alternatives for the proposed anesthesia with the patient or authorized representative who has indicated his/her understanding and acceptance.     Dental advisory given  Plan Discussed with: CRNA and Anesthesiologist  Anesthesia Plan Comments:        Anesthesia Quick Evaluation

## 2019-12-12 NOTE — Anesthesia Postprocedure Evaluation (Signed)
Anesthesia Post Note  Patient: Robert Conner  Procedure(s) Performed: Creation of RIGHT  ARM Radial-cephalic  ARTERIOVENOUS (AV) FISTULA . (Right Arm Lower) INSERTION OF Left Internal Jugular  DIALYSIS CATHETER. (Left Neck)     Patient location during evaluation: PACU Anesthesia Type: General Level of consciousness: awake and alert Pain management: pain level controlled Vital Signs Assessment: post-procedure vital signs reviewed and stable Respiratory status: spontaneous breathing, nonlabored ventilation and respiratory function stable Cardiovascular status: blood pressure returned to baseline and stable Postop Assessment: no apparent nausea or vomiting Anesthetic complications: no   No complications documented.  Last Vitals:  Vitals:   12/12/19 1236 12/12/19 1251  BP:  (!) 141/79  Pulse: 86 87  Resp: 20 20  Temp:    SpO2: 98% 97%    Last Pain:  Vitals:   12/12/19 1236  TempSrc:   PainSc: 0-No pain                 Audry Pili

## 2019-12-12 NOTE — Op Note (Signed)
DATE OF SERVICE: 12/12/2019  PATIENT:  Robert Conner  24 y.o. male  PRE-OPERATIVE DIAGNOSIS:  CHRONIC KIDNEY DISEASE STAGE V  POST-OPERATIVE DIAGNOSIS:  Chronic Kidney Disease/ Stage V  PROCEDURE:  Procedure(s): Creation of RIGHT  ARM Radial-cephalic  ARTERIOVENOUS (AV) FISTULA . (Right) INSERTION OF Left Internal Jugular  DIALYSIS CATHETER. (Left)  SURGEON:  Surgeon(s) and Role:    * Cherre Robins, MD - Primary  ASSISTANT: Arlee Muslim, PA-C  An assistant was required to facilitate exposure and expedite the case.  ANESTHESIA:   general  EBL: min  BLOOD ADMINISTERED:none  DRAINS: none   LOCAL MEDICATIONS USED:  NONE  SPECIMEN:  none  COUNTS: confirmed correct.  TOURNIQUET:  * No tourniquets in log *  PATIENT DISPOSITION:  PACU - hemodynamically stable.   Delay start of Pharmacological VTE agent (>24hrs) due to surgical blood loss or risk of bleeding: no  INDICATION FOR PROCEDURE: Robert Conner is a 24 y.o. male with CKD approaching ESRD. After careful discussion of risks, benefits, and alternatives the patient was offered tunneled dialysis catheter and AV fistula creation. The patient understood and wished to proceed.  OPERATIVE FINDINGS: adequate cephalic vein and radial artery for radiocephalic arteriovenous fistula.  DESCRIPTION OF PROCEDURE: After identification of the patient in the pre-operative holding area, the patient was transferred to the operating room. The patient was positioned supine on the operating room table. Anesthesia was induced. The neck, chest and right arm were prepped and draped in standard fashion. A surgical pause was performed confirming correct patient, procedure, and operative location.  Using ultrasound guidance the left internal jugular vein was accessed with micropuncture technique.  Through the micropuncture sheath a floppy J-wire was advanced into the superior vena cava.  A small incision was made around the skin access point.  The  access point was serially dilated under direct fluoroscopic guidance.  A peel-away sheath was introduced into the superior vena cava under fluoroscopic guidance.  A counterincision was made in the chest under the clavicle.  A 23 cm tunnel dialysis catheter was then tunneled under the skin, over the clavicle into the incision in the neck.  The tunneling device was removed and the catheter fed through the peel-away sheath into the superior vena cava.  The peel-away sheath was removed and the catheter gently pulled back.  Adequate position was confirmed with x-ray.  The catheter was tested and found to flush and draw back well.  Catheter was heparin locked.  Caps were applied.  Catheter was sutured to the skin.  The neck incision was closed with 4-0 Monocryl.  Using intraoperative ultrasound the course of the cephalic vein and radial artery were mapped.  Both appeared adequate for AV fistula creation.  A longitudinal incision was made over the radial wrist between the two vessels.  The cephalic vein was exposed circumferentially.  The radial artery was exposed circumferentially.  Silastic Vesseloops were used to encircled the radial artery proximally distally.  The patient was systemically heparinized.  The distal aspect of the cephalic vein was transected.  The distal stump was oversewn with a 2-0 silk.  The proximal cephalic vein was controlled with a bulldog clamp.  The radial artery was clamped proximally distally.  An anterior arteriotomy was made with an 11 blade and extended with Potts scissors.  The cephalic vein was anastomosed to the radial artery end-to-side using continuous running suture of 6-0 Prolene with parachute technique.  Immediately prior to completion the anastomosis was flushed and de-aired.  Clamps  were released on the cephalic vein, proximal/distal radial artery.  Hemostasis was achieved.  Doppler machine was used to interrogate the fistula.  Excellent bruit was heard in the fistula.   Biphasic Doppler signal was heard in the radial artery distal to the fistula.  Triphasic signal was heard proximal to the fistula.  The wound was irrigated.  The wound was closed in layers using 3-0 Vicryl and 4-0 Monocryl.  Bandage was applied.  Upon completion of the case instrument and sharps counts were confirmed correct. The patient was transferred to the PACU in good condition. I was present for all portions of the procedure.  Yevonne Aline. Stanford Breed, MD [facility] Phone Number: [phone] 12/12/2019 11:44 AM

## 2019-12-12 NOTE — Anesthesia Procedure Notes (Signed)
Procedure Name: LMA Insertion Date/Time: 12/12/2019 10:01 AM Performed by: Inda Coke, CRNA Pre-anesthesia Checklist: Patient identified, Emergency Drugs available, Suction available and Patient being monitored Patient Re-evaluated:Patient Re-evaluated prior to induction Oxygen Delivery Method: Circle System Utilized Preoxygenation: Pre-oxygenation with 100% oxygen Induction Type: IV induction Ventilation: Mask ventilation without difficulty LMA: LMA inserted LMA Size: 4.0 Number of attempts: 1 Airway Equipment and Method: Bite block Placement Confirmation: positive ETCO2 Tube secured with: Tape Dental Injury: Teeth and Oropharynx as per pre-operative assessment

## 2019-12-12 NOTE — Progress Notes (Signed)
   ASSESSMENT & PLAN:  Robert Conner is a 24 y.o. male with CKD approaching ESRD.  Plan RUE radiocephalic AVF with tunneled dialysis catheter placement today. All questions answered. He is ready to proceed.    SUBJECTIVE:  No complaints. All questions answered.  OBJECTIVE:  BP (!) 124/52 (BP Location: Right Arm)   Pulse 99   Temp 98.8 F (37.1 C) (Oral)   Resp 11   Ht 5\' 6"  (1.676 m)   Wt 101.5 kg   SpO2 99%   BMI 36.12 kg/m   Intake/Output Summary (Last 24 hours) at 12/12/2019 0942 Last data filed at 12/11/2019 1700 Gross per 24 hour  Intake 52.3 ml  Output --  Net 52.3 ml    Constitutional: well appearing. no acute distress. Vascular: R radial pulse 2+  CBC Latest Ref Rng & Units 12/12/2019 12/10/2019 12/09/2019  WBC 4.0 - 10.5 K/uL 7.7 10.1 10.1  Hemoglobin 13.0 - 17.0 g/dL 10.2(L) 11.2(L) 11.1(L)  Hematocrit 39 - 52 % 31.0(L) 34.3(L) 33.6(L)  Platelets 150 - 400 K/uL 329 277 244     CMP Latest Ref Rng & Units 12/12/2019 12/11/2019 12/10/2019  Glucose 70 - 99 mg/dL 122(H) 106(H) 106(H)  BUN 6 - 20 mg/dL 63(H) 61(H) 59(H)  Creatinine 0.61 - 1.24 mg/dL 10.91(H) 11.19(H) 11.05(H)  Sodium 135 - 145 mmol/L 136 135 126(L)  Potassium 3.5 - 5.1 mmol/L 4.3 4.2 3.6  Chloride 98 - 111 mmol/L 100 100 96(L)  CO2 22 - 32 mmol/L 21(L) 20(L) 20(L)  Calcium 8.9 - 10.3 mg/dL 9.1 9.3 8.9  Total Protein 6.5 - 8.1 g/dL - - -  Total Bilirubin 0.3 - 1.2 mg/dL - - -  Alkaline Phos 38 - 126 U/L - - -  AST 15 - 41 U/L - - -  ALT 17 - 63 U/L - - -    Estimated Creatinine Clearance: 11.8 mL/min (A) (by C-G formula based on SCr of 10.91 mg/dL (H)).  Yevonne Aline. Stanford Breed, MD [facility] Phone Number: [phone] 12/12/2019 9:42 AM

## 2019-12-12 NOTE — Plan of Care (Signed)

## 2019-12-13 ENCOUNTER — Encounter (HOSPITAL_COMMUNITY): Payer: Self-pay | Admitting: Vascular Surgery

## 2019-12-13 LAB — RENAL FUNCTION PANEL
Albumin: 3.4 g/dL — ABNORMAL LOW (ref 3.5–5.0)
Anion gap: 12 (ref 5–15)
BUN: 67 mg/dL — ABNORMAL HIGH (ref 6–20)
CO2: 19 mmol/L — ABNORMAL LOW (ref 22–32)
Calcium: 9.5 mg/dL (ref 8.9–10.3)
Chloride: 103 mmol/L (ref 98–111)
Creatinine, Ser: 10.92 mg/dL — ABNORMAL HIGH (ref 0.61–1.24)
GFR, Estimated: 6 mL/min — ABNORMAL LOW (ref >60)
Glucose, Bld: 104 mg/dL — ABNORMAL HIGH (ref 70–99)
Phosphorus: 5.3 mg/dL — ABNORMAL HIGH (ref 2.5–4.6)
Potassium: 5.4 mmol/L — ABNORMAL HIGH (ref 3.5–5.1)
Sodium: 134 mmol/L — ABNORMAL LOW (ref 135–145)

## 2019-12-13 LAB — METANEPHRINES, URINE, 24 HOUR
Metaneph Total, Ur: 126 ug/L
Metanephrines, 24H Ur: 265 ug/24 hr (ref 58–276)
Normetanephrine, 24H Ur: 825 ug/24 hr — ABNORMAL HIGH (ref 110–553)
Normetanephrine, Ur: 393 ug/L
Total Volume: 2100

## 2019-12-13 LAB — HEPATITIS B SURFACE ANTIGEN: Hepatitis B Surface Ag: NONREACTIVE

## 2019-12-13 LAB — DRUG PROFILE, UR, 9 DRUGS (LABCORP)
Amphetamines, Urine: NEGATIVE ng/mL
Barbiturate, Ur: NEGATIVE ng/mL
Benzodiazepine Quant, Ur: NEGATIVE ng/mL
Cannabinoid Quant, Ur: NEGATIVE ng/mL
Cocaine (Metab.): NEGATIVE ng/mL
Methadone Screen, Urine: NEGATIVE ng/mL
Opiate Quant, Ur: NEGATIVE ng/mL
Phencyclidine, Ur: NEGATIVE ng/mL
Propoxyphene, Urine: NEGATIVE ng/mL

## 2019-12-13 LAB — CBC
HCT: 28.7 % — ABNORMAL LOW (ref 39.0–52.0)
Hemoglobin: 10 g/dL — ABNORMAL LOW (ref 13.0–17.0)
MCH: 32.4 pg (ref 26.0–34.0)
MCHC: 34.8 g/dL (ref 30.0–36.0)
MCV: 92.9 fL (ref 80.0–100.0)
Platelets: 328 10*3/uL (ref 150–400)
RBC: 3.09 MIL/uL — ABNORMAL LOW (ref 4.22–5.81)
RDW: 13.6 % (ref 11.5–15.5)
WBC: 13.2 10*3/uL — ABNORMAL HIGH (ref 4.0–10.5)
nRBC: 0 % (ref 0.0–0.2)

## 2019-12-13 LAB — HEPATITIS B SURFACE ANTIBODY,QUALITATIVE: Hep B S Ab: NONREACTIVE

## 2019-12-13 LAB — HEPATITIS B CORE ANTIBODY, TOTAL: Hep B Core Total Ab: NONREACTIVE

## 2019-12-13 MED ORDER — PENTAFLUOROPROP-TETRAFLUOROETH EX AERO: 1.0000 "application " | INHALATION_SPRAY | CUTANEOUS | Status: DC | PRN

## 2019-12-13 MED ORDER — HEPARIN SODIUM (PORCINE) 1000 UNIT/ML DIALYSIS: 20.0000 [IU]/kg | INTRAMUSCULAR | Status: DC | PRN

## 2019-12-13 MED ORDER — HEPARIN SODIUM (PORCINE) 1000 UNIT/ML IJ SOLN
INTRAMUSCULAR | Status: AC
  Administered 2019-12-13: 3800 [IU] via INTRAVENOUS_CENTRAL
  Filled 2019-12-13: qty 4

## 2019-12-13 MED ORDER — HEPARIN SODIUM (PORCINE) 1000 UNIT/ML DIALYSIS: 1000.0000 [IU] | INTRAMUSCULAR | Status: DC | PRN

## 2019-12-13 MED ORDER — SODIUM CHLORIDE 0.9 % IV SOLN: 100.0000 mL | INTRAVENOUS | Status: DC | PRN

## 2019-12-13 MED ORDER — LIDOCAINE-PRILOCAINE 2.5-2.5 % EX CREA: 1.0000 "application " | TOPICAL_CREAM | CUTANEOUS | Status: DC | PRN

## 2019-12-13 MED ORDER — LIDOCAINE HCL (PF) 1 % IJ SOLN: 5.0000 mL | INTRAMUSCULAR | Status: DC | PRN

## 2019-12-13 MED ORDER — ALTEPLASE 2 MG IJ SOLR: 2.0000 mg | Freq: Once | INTRAMUSCULAR | Status: DC | PRN

## 2019-12-13 NOTE — Progress Notes (Addendum)
Renal Navigator met with patient at HD bedside to discuss referral to outpatient HD clinic per notification by Dr. Marval Regal. Patient is having his first treatment today and states he feels fine. He reports having good support and his own transportation. He has no preference in outpatient clinic, other than the one that will get him in the fastest, so he can be discharged home the soonest.  Navigator contacted [facility] Social Worker/S. Mosley, and patient has been accepted by Dr. Marga Hoots. Patient has been seen by this Nephrologist in the past. He has been given at TTS schedule with a seat time of 11:50am. He should arrive to his appointments at 11:30am.  Dr. Marval Regal updated and plan will be for patient to have one more inpatient HD treatment tomorrow and discharge following, barring any unforeseen events. Patient updated and kidney failure educational material given with encouragement to read and share with support system.  Alphonzo Cruise, [facility] Navigator [phone]

## 2019-12-13 NOTE — Progress Notes (Signed)
S: Seen on HD and is without complaints. O:BP 132/70 (BP Location: Left Arm)   Pulse 99   Temp 98.7 F (37.1 C) (Oral)   Resp 18   Ht 5\' 6"  (1.676 m)   Wt 100.5 kg   SpO2 100%   BMI 35.77 kg/m   Intake/Output Summary (Last 24 hours) at 12/13/2019 1038 Last data filed at 12/13/2019 0330 Gross per 24 hour  Intake 1566.83 ml  Output 15 ml  Net 1551.83 ml   Intake/Output: I/O last 3 completed shifts: In: 1616.8 [P.O.:720; I.V.:596.8; IV Piggyback:300] Out: 15 [Blood:15]  Intake/Output this shift:  No intake/output data recorded. Weight change: -0.983 kg Gen: NAD CVS: RRR Resp: CTA Abd: +BS, soft, NT/ND Ext: no edema, LAVF +T/B  Recent Labs  Lab 12/08/19 0727 12/09/19 1046 12/10/19 0354 12/10/19 0529 12/11/19 0622 12/12/19 0054 12/13/19 0253  NA 133* 135 128* 126* 135 136 134*  K 3.4* 3.5 3.5 3.6 4.2 4.3 5.4*  CL 100 101 99 96* 100 100 103  CO2 20* 19* 19* 20* 20* 21* 19*  GLUCOSE 101* 99 97 106* 106* 122* 104*  BUN 57* 60* 61* 59* 61* 63* 67*  CREATININE 10.08* 10.97* 11.22* 11.05* 11.19* 10.91* 10.92*  ALBUMIN  --   --  3.2*  --  3.1* 3.0* 3.4*  CALCIUM 8.7* 9.1 8.8* 8.9 9.3 9.1 9.5  PHOS  --   --  5.3*  --  7.0* 6.2* 5.3*   Liver Function Tests: Recent Labs  Lab 12/11/19 0622 12/12/19 0054 12/13/19 0253  ALBUMIN 3.1* 3.0* 3.4*   No results for input(s): LIPASE, AMYLASE in the last 168 hours. No results for input(s): AMMONIA in the last 168 hours. CBC: Recent Labs  Lab 12/07/19 1528 12/07/19 1528 12/08/19 0727 12/08/19 0727 12/09/19 1046 12/10/19 0529 12/12/19 0054  WBC 13.2*   < > 13.8*   < > 10.1 10.1 7.7  NEUTROABS 10.7*  --   --   --   --   --   --   HGB 14.0   < > 11.7*   < > 11.1* 11.2* 10.2*  HCT 40.3   < > 33.8*   < > 33.6* 34.3* 31.0*  MCV 90.2  --  89.9  --  92.8 94.2 93.9  PLT 171   < > 173   < > 244 277 329   < > = values in this interval not displayed.   Cardiac Enzymes: No results for input(s): CKTOTAL, CKMB, CKMBINDEX,  TROPONINI in the last 168 hours. CBG: Recent Labs  Lab 12/07/19 1710 12/08/19 0151  GLUCAP 123* 114*    Iron Studies: No results for input(s): IRON, TIBC, TRANSFERRIN, FERRITIN in the last 72 hours. Studies/Results: DG CHEST PORT 1 VIEW  Result Date: 12/12/2019 CLINICAL DATA:  Central catheter placement EXAM: PORTABLE CHEST 1 VIEW COMPARISON:  April 21, 2017 FINDINGS: Central catheter tip is at the cavoatrial junction. No pneumothorax. Lungs are clear. Heart is mildly enlarged with pulmonary vascularity normal. No adenopathy. No bone lesions. IMPRESSION: Central catheter tip at cavoatrial junction. No pneumothorax. Mild cardiomegaly. Lungs clear. Electronically Signed   By: Lowella Grip III M.D.   On: 12/12/2019 12:21   DG Fluoro Guide CV Line-No Report  Result Date: 12/12/2019 Fluoroscopy was utilized by the requesting physician.  No radiographic interpretation.   VAS [place] UPPER EXT VEIN MAPPING (PRE-OP AVF)  Result Date: 12/11/2019 UPPER EXTREMITY VEIN MAPPING  Indications: Pre-access. History: End stage renal disease.  Limitations: Bandaging- PICC line  LT Comparison Study: No prior studies. Performing Technologist: Darlin Coco, RDMS  Examination Guidelines: A complete evaluation includes B-mode imaging, spectral Doppler, color Doppler, and power Doppler as needed of all accessible portions of each vessel. Bilateral testing is considered an integral part of a complete examination. Limited examinations for reoccurring indications may be performed as noted. +-----------------+-------------+----------+---------+ Right Cephalic   Diameter (cm)Depth (cm)Findings  +-----------------+-------------+----------+---------+ Shoulder             0.52        0.45             +-----------------+-------------+----------+---------+ Prox upper arm       0.54        0.83   branching +-----------------+-------------+----------+---------+ Mid upper arm        0.45        0.30              +-----------------+-------------+----------+---------+ Dist upper arm       0.57        0.47             +-----------------+-------------+----------+---------+ Antecubital fossa    0.68        0.41             +-----------------+-------------+----------+---------+ Prox forearm         0.44        0.47             +-----------------+-------------+----------+---------+ Mid forearm          0.40        0.41             +-----------------+-------------+----------+---------+ Dist forearm         0.38        0.29             +-----------------+-------------+----------+---------+ Wrist                0.36        0.29             +-----------------+-------------+----------+---------+ +-----------------+-------------+----------+---------+ Right Basilic    Diameter (cm)Depth (cm)Findings  +-----------------+-------------+----------+---------+ Mid upper arm        0.59                         +-----------------+-------------+----------+---------+ Dist upper arm       0.62               branching +-----------------+-------------+----------+---------+ Antecubital fossa    0.52               branching +-----------------+-------------+----------+---------+ Prox forearm         0.40               branching +-----------------+-------------+----------+---------+ Mid forearm          0.37                         +-----------------+-------------+----------+---------+ Distal forearm       0.38                         +-----------------+-------------+----------+---------+ Wrist                0.24               branching +-----------------+-------------+----------+---------+ +-----------------+-------------+----------+--------------+ Left Cephalic    Diameter (cm)Depth (cm)   Findings    +-----------------+-------------+----------+--------------+ Shoulder  0.43        0.95                  +-----------------+-------------+----------+--------------+  Prox upper arm       0.37        0.98                  +-----------------+-------------+----------+--------------+ Mid upper arm        0.38        0.40                  +-----------------+-------------+----------+--------------+ Dist upper arm       0.43        0.42                  +-----------------+-------------+----------+--------------+ Antecubital fossa                       not visualized +-----------------+-------------+----------+--------------+ Prox forearm                            not visualized +-----------------+-------------+----------+--------------+ Mid forearm          0.39        0.59                  +-----------------+-------------+----------+--------------+ Dist forearm         0.42        0.42                  +-----------------+-------------+----------+--------------+ Wrist                                   not visualized +-----------------+-------------+----------+--------------+ *See table(s) above for measurements and observations.  Diagnosing physician: Harold Barban MD Electronically signed by Harold Barban MD on 12/11/2019 at 8:55:33 PM.    Final    . amLODipine  10 mg Oral Daily  . Chlorhexidine Gluconate Cloth  6 each Topical Q0600  . docusate sodium  100 mg Oral Daily  . heparin  5,000 Units Subcutaneous Q8H  . hydrALAZINE  100 mg Oral Q8H  . [START ON 12/14/2019] influenza vac split quadrivalent PF  0.5 mL Intramuscular Tomorrow-1000  . labetalol  100 mg Oral BID  . lanthanum  1,000 mg Oral TID WC  . [START ON 12/14/2019] pneumococcal 23 valent vaccine  0.5 mL Intramuscular Tomorrow-1000  . polyethylene glycol  17 g Oral Daily    BMET    Component Value Date/Time   NA 134 (L) 12/13/2019 0253   K 5.4 (H) 12/13/2019 0253   CL 103 12/13/2019 0253   CO2 19 (L) 12/13/2019 0253   GLUCOSE 104 (H) 12/13/2019 0253   BUN 67 (H) 12/13/2019 0253   CREATININE 10.92 (H) 12/13/2019 0253   CALCIUM 9.5 12/13/2019 0253   GFRNONAA 6 (L)  12/13/2019 0253   GFRAA 33 (L) 04/22/2017 0448   CBC    Component Value Date/Time   WBC 7.7 12/12/2019 0054   RBC 3.30 (L) 12/12/2019 0054   HGB 10.2 (L) 12/12/2019 0054   HCT 31.0 (L) 12/12/2019 0054   PLT 329 12/12/2019 0054   MCV 93.9 12/12/2019 0054   MCH 30.9 12/12/2019 0054   MCHC 32.9 12/12/2019 0054   RDW 14.0 12/12/2019 0054   LYMPHSABS 2.0 12/07/2019 1528   MONOABS 0.8 12/07/2019 1528   EOSABS 0.0 12/07/2019 1528  BASOSABS 0.1 12/07/2019 1528    Assessment/Plan:  1. New ESRD- has not been seen by his Nephrologist in over a year. His Scr was 3.97 on 06/07/18. None in the interim and has progressed to ESRD.  1. Discussed the likelihood of requiring ongoing dialysisand he is amenable to start.  2. S/p R forearm AVF creation and LIJ TDC placement by Dr. Stanford Breed 12/12/19 3. Will involve renal navigator to help with outpatient dialysis 4. No uremic symptoms at this time. 5. Discussed different modalities of RRT including incenter HD, home HD, and PD. He is not interested in home therapies at this time. 2. Hypertensive emergency- presented with headache and fatigue. Imaging studies with possible atypical PRES. BP markedly improved with clevidipine drip. Also on amlodipine 10 mg daily, hydralazine 100 mg tid. Lisinopril on hold due to AKI/CKD 1. BP's markedly improved 2. Renal artery duplex without evidence of RAS, UDS negative 3. Continue with secondary HTN workup (renin, aldo, catecholamines and metanephrines pending) 3. Hyponatremia- improved 4. Metabolic acidosis- stable 5. Anemia of CKD- no indication for ESA with Hgb >10 at this time. 6. SHPTH-hyperphosphatemia and will start binders. iPTH 50 no vit D for now. 7. Hypokalemia- stable.   Donetta Potts, MD [employer] [phone]

## 2019-12-13 NOTE — Progress Notes (Signed)
  Progress Note    12/13/2019 7:39 AM 1 Day Post-Op  Subjective:  No complaints. Eager to go home   Vitals:   12/12/19 2300 12/13/19 0540  BP: (!) 179/99 132/70  Pulse:  99  Resp: 18 18  Temp:  98.7 F (37.1 C)  SpO2:  100%   Physical Exam: Cardiac: regular, Left IJ TDC sites clean, dry and intact Lungs: non labored Incisions:  Right radial incision clean, dry and intact. No swelling or hematoma Extremities: 2+ radial pulse, right hand and fingers warm. 5/5 grip strength Neurologic: alert and oriented  CBC    Component Value Date/Time   WBC 7.7 12/12/2019 0054   RBC 3.30 (L) 12/12/2019 0054   HGB 10.2 (L) 12/12/2019 0054   HCT 31.0 (L) 12/12/2019 0054   PLT 329 12/12/2019 0054   MCV 93.9 12/12/2019 0054   MCH 30.9 12/12/2019 0054   MCHC 32.9 12/12/2019 0054   RDW 14.0 12/12/2019 0054   LYMPHSABS 2.0 12/07/2019 1528   MONOABS 0.8 12/07/2019 1528   EOSABS 0.0 12/07/2019 1528   BASOSABS 0.1 12/07/2019 1528    BMET    Component Value Date/Time   NA 134 (L) 12/13/2019 0253   K 5.4 (H) 12/13/2019 0253   CL 103 12/13/2019 0253   CO2 19 (L) 12/13/2019 0253   GLUCOSE 104 (H) 12/13/2019 0253   BUN 67 (H) 12/13/2019 0253   CREATININE 10.92 (H) 12/13/2019 0253   CALCIUM 9.5 12/13/2019 0253   GFRNONAA 6 (L) 12/13/2019 0253   GFRAA 33 (L) 04/22/2017 0448    INR No results found for: INR   Intake/Output Summary (Last 24 hours) at 12/13/2019 0739 Last data filed at 12/13/2019 0330 Gross per 24 hour  Intake 1616.83 ml  Output 15 ml  Net 1601.83 ml     Assessment/Plan:  24 y.o. male is s/p right radiocephalic AV fistula 1 Day Post-Op. Patient is doing well post op. Very minimal to no surgical site pain. Mild tingling in right thumb. No steal symptoms otherwise. Fistula with good thrill. Incision intact and looking good. Left IJ TDC sites clean and dry. Has not dialyzed yet. Scheduled for HD today. If there are any issues we will be available. Patient otherwise okay  for discharge from vascular standpoint. He has a scheduled follow up with [place] on 01/14/19    Karoline Caldwell, PA-C [facility] [phone] 12/13/2019 7:39 AM

## 2019-12-13 NOTE — Progress Notes (Signed)
PROGRESS NOTE    [place] Ice  IEP:[id number] DOB: 06-Aug-1995 DOA: 12/07/2019 PCP: Patient, No Pcp Per    Brief Narrative:  This 24 years old male with past medical history of hypertension with noncompliance presented in the [facility] emergency room with complaints of headache and blurry vision,  found to have hypertensive emergency.  He was referred by ophthalmology office where he was found to have papilledema in both eyes. He was found to have blood pressure of 256/ 172 in the ED.  He was started on Cleviprex drip,  CT head was without any acute findings.  Serum creatinine was found to be elevated at 10 , His baseline creatinine in 2019 was 3.  He has not followed up with any primary care physician afterwards. Patient was admitted initially in the ICU requiring Cleviprex drip. Nephrology consulted recommended hemodialysis during this hospitalization. PCCM pickup 12/2> Cleviprex discontinued.  blood pressure improved.  Nephrology consulted vascular surgery,  He underwent AV fistula and tunneled catheter 12/2 followed by hemodialysis 12/3.  Assessment & Plan:   Principal Problem:   Hypertensive emergency Active Problems:   Papilledema   AKI (acute kidney injury) ([place])  Hypertensive Emergency- Resolved. He was found to have blood pressure 256 /172 in the ED.   He was referred by ophthalmologist after being found to have bilateral papilledema . Patient was started on Cleviprex , BP decreasing on Cleviprex, hydralazine, and amlodipine.  Blood pressure improved 145/85 He is off Cleviprex 12/1 Continue amlodipine 10 mg daily, hydralazine 100 mg 3 times daily and labetalol 100mg  BID No renal artery stenosis on [place], urine drug screen negative, TSH normal pending urine metanephrines and catecholamines, aldosterone, rennin for secondary hypertension He needs ophthalmology follow up once discharged  AKI/CKD stage IV -  Nephrology recommending starting HD during his admission.  -  Vascular  surgery consulted patient underwent right arm radiocephalic AV fistula, left internal jugular dialysis catheter. -Patient is starting hemodialysis today. -Case management working on  outpatient hemodialysis schedule.  DVT prophylaxis: Lovenox Code Status: Full code. Family Communication: Domestic partner was at bedside Disposition Plan: Status is: Inpatient  Remains inpatient appropriate because:Inpatient level of care appropriate due to severity of illness   Dispo: The patient is from: Home              Anticipated d/c is to: Home              Anticipated d/c date is: 1 days.              Patient currently is not medically stable to d/c.   Consultants:   Nephrology  Vascular surgery  Procedures: AV fistula, tunneled hemodialysis catheter.  Antimicrobials:  Anti-infectives (From admission, onward)   Start     Dose/Rate Route Frequency Ordered Stop   12/12/19 0900  ceFAZolin (ANCEF) IVPB 1 g/50 mL premix       Note to Pharmacy: Send with pt to OR   1 g 100 mL/hr over 30 Minutes Intravenous On call 12/11/19 0657 12/12/19 1005      Subjective: Patient was seen and examined at bedside.  Overnight events noted.   Patient is s/p AV fistula and tunneled hemodialysis catheter 12/2.  Patient reports feeling better.  He asks when he can be discharged home.  Objective: Vitals:   12/13/19 1200 12/13/19 1230 12/13/19 1253 12/13/19 1358  BP: (!) 162/88 (!) 162/87 (!) 141/69 (!) 168/84  Pulse:    82  Resp: 18 20 (!) 21 18  Temp:   98.2 F (36.8 C) 98.5 F (36.9 C)  TempSrc:   Oral Oral  SpO2:   100%   Weight:   101.2 kg   Height:        Intake/Output Summary (Last 24 hours) at 12/13/2019 1415 Last data filed at 12/13/2019 1253 Gross per 24 hour  Intake 720 ml  Output 500 ml  Net 220 ml   Filed Weights   12/13/19 0540 12/13/19 1015 12/13/19 1253  Weight: 100.5 kg 101.9 kg 101.2 kg    Examination:  General exam: Appears calm and comfortable , not in any  distress Respiratory system: Clear to auscultation. Respiratory effort normal. Cardiovascular system: S1 & S2 heard, RRR. No JVD, murmurs, rubs, gallops or clicks. No pedal edema. Gastrointestinal system: Abdomen is nondistended, soft and nontender. No organomegaly or masses felt. Normal bowel sounds heard. Central nervous system: Alert and oriented. No focal neurological deficits. Extremities: Right arm AV fistula.  Left internal jugular catheter. Skin: No rashes, lesions or ulcers Psychiatry: Judgement and insight appear normal. Mood & affect appropriate.     Data Reviewed: I have personally reviewed following labs and imaging studies  CBC: Recent Labs  Lab 12/07/19 1528 12/08/19 0727 12/09/19 1046 12/10/19 0529 12/12/19 0054  WBC 13.2* 13.8* 10.1 10.1 7.7  NEUTROABS 10.7*  --   --   --   --   HGB 14.0 11.7* 11.1* 11.2* 10.2*  HCT 40.3 33.8* 33.6* 34.3* 31.0*  MCV 90.2 89.9 92.8 94.2 93.9  PLT 171 173 244 277 254   Basic Metabolic Panel: Recent Labs  Lab 12/10/19 0354 12/10/19 0529 12/11/19 0622 12/12/19 0054 12/13/19 0253  NA 128* 126* 135 136 134*  K 3.5 3.6 4.2 4.3 5.4*  CL 99 96* 100 100 103  CO2 19* 20* 20* 21* 19*  GLUCOSE 97 106* 106* 122* 104*  BUN 61* 59* 61* 63* 67*  CREATININE 11.22* 11.05* 11.19* 10.91* 10.92*  CALCIUM 8.8* 8.9 9.3 9.1 9.5  PHOS 5.3*  --  7.0* 6.2* 5.3*   GFR: Estimated Creatinine Clearance: 11.7 mL/min (A) (by C-G formula based on SCr of 10.92 mg/dL (H)). Liver Function Tests: Recent Labs  Lab 12/10/19 0354 12/11/19 0622 12/12/19 0054 12/13/19 0253  ALBUMIN 3.2* 3.1* 3.0* 3.4*   No results for input(s): LIPASE, AMYLASE in the last 168 hours. No results for input(s): AMMONIA in the last 168 hours. Coagulation Profile: No results for input(s): INR, PROTIME in the last 168 hours. Cardiac Enzymes: No results for input(s): CKTOTAL, CKMB, CKMBINDEX, TROPONINI in the last 168 hours. BNP (last 3 results) No results for input(s):  PROBNP in the last 8760 hours. HbA1C: No results for input(s): HGBA1C in the last 72 hours. CBG: Recent Labs  Lab 12/07/19 1710 12/08/19 0151  GLUCAP 123* 114*   Lipid Profile: No results for input(s): CHOL, HDL, LDLCALC, TRIG, CHOLHDL, LDLDIRECT in the last 72 hours. Thyroid Function Tests: No results for input(s): TSH, T4TOTAL, FREET4, T3FREE, THYROIDAB in the last 72 hours. Anemia Panel: No results for input(s): VITAMINB12, FOLATE, FERRITIN, TIBC, IRON, RETICCTPCT in the last 72 hours. Sepsis Labs: No results for input(s): PROCALCITON, LATICACIDVEN in the last 168 hours.  Recent Results (from the past 240 hour(s))  Resp Panel by RT-PCR (Flu A&B, Covid) Nasopharyngeal Swab     Status: None   Collection Time: 12/07/19  7:45 PM   Specimen: Nasopharyngeal Swab; Nasopharyngeal(NP) swabs in vial transport medium  Result Value Ref Range Status   SARS Coronavirus 2 by  RT PCR NEGATIVE NEGATIVE Final    Comment: (NOTE) SARS-CoV-2 target nucleic acids are NOT DETECTED.  The SARS-CoV-2 RNA is generally detectable in upper respiratory specimens during the acute phase of infection. The lowest concentration of SARS-CoV-2 viral copies this assay can detect is 138 copies/mL. A negative result does not preclude SARS-Cov-2 infection and should not be used as the sole basis for treatment or other patient management decisions. A negative result may occur with  improper specimen collection/handling, submission of specimen other than nasopharyngeal swab, presence of viral mutation(s) within the areas targeted by this assay, and inadequate number of viral copies(<138 copies/mL). A negative result must be combined with clinical observations, patient history, and epidemiological information. The expected result is Negative.  Fact Sheet for Patients:  EntrepreneurPulse.com.au  Fact Sheet for Healthcare Providers:  IncredibleEmployment.be  This test is no t  yet approved or cleared by the [place] FDA and  has been authorized for detection and/or diagnosis of SARS-CoV-2 by FDA under an Emergency Use Authorization (EUA). This EUA will remain  in effect (meaning this test can be used) for the duration of the COVID-19 declaration under Section 564(b)(1) of the Act, 21 U.S.C.section 360bbb-3(b)(1), unless the authorization is terminated  or revoked sooner.       Influenza A by PCR NEGATIVE NEGATIVE Final   Influenza B by PCR NEGATIVE NEGATIVE Final    Comment: (NOTE) The Xpert Xpress SARS-CoV-2/FLU/RSV plus assay is intended as an aid in the diagnosis of influenza from Nasopharyngeal swab specimens and should not be used as a sole basis for treatment. Nasal washings and aspirates are unacceptable for Xpert Xpress SARS-CoV-2/FLU/RSV testing.  Fact Sheet for Patients: EntrepreneurPulse.com.au  Fact Sheet for Healthcare Providers: IncredibleEmployment.be  This test is not yet approved or cleared by the [place] FDA and has been authorized for detection and/or diagnosis of SARS-CoV-2 by FDA under an Emergency Use Authorization (EUA). This EUA will remain in effect (meaning this test can be used) for the duration of the COVID-19 declaration under Section 564(b)(1) of the Act, 21 U.S.C. section 360bbb-3(b)(1), unless the authorization is terminated or revoked.  Performed at [facility], [place] [address] [place], [place] [address]   MRSA PCR Screening     Status: None   Collection Time: 12/08/19  1:50 AM   Specimen: Nasopharyngeal  Result Value Ref Range Status   MRSA by PCR NEGATIVE NEGATIVE Final    Comment:        The GeneXpert MRSA Assay (FDA approved for NASAL specimens only), is one component of a comprehensive MRSA colonization surveillance program. It is not intended to diagnose MRSA infection nor to guide or monitor treatment for MRSA infections. Performed at  [facility], [place] [address] [place], [place] [address]   Surgical PCR screen     Status: None   Collection Time: 12/12/19  2:56 AM   Specimen: Nasal Mucosa; Nasal Swab  Result Value Ref Range Status   MRSA, PCR NEGATIVE NEGATIVE Final   Staphylococcus aureus NEGATIVE NEGATIVE Final    Comment: (NOTE) The Xpert SA Assay (FDA approved for NASAL specimens in patients 72 years of age and older), is one component of a comprehensive surveillance program. It is not intended to diagnose infection nor to guide or monitor treatment. Performed at [facility], [place] [address] [place], [place] [address]          Radiology Studies: DG CHEST PORT 1 VIEW  Result Date: 12/12/2019 CLINICAL  DATA:  Central catheter placement EXAM: PORTABLE CHEST 1 VIEW COMPARISON:  April 21, 2017 FINDINGS: Central catheter tip is at the cavoatrial junction. No pneumothorax. Lungs are clear. Heart is mildly enlarged with pulmonary vascularity normal. No adenopathy. No bone lesions. IMPRESSION: Central catheter tip at cavoatrial junction. No pneumothorax. Mild cardiomegaly. Lungs clear. Electronically Signed   By: Lowella Grip III M.D.   On: 12/12/2019 12:21   DG Fluoro Guide CV Line-No Report  Result Date: 12/12/2019 Fluoroscopy was utilized by the requesting physician.  No radiographic interpretation.   Scheduled Meds: . amLODipine  10 mg Oral Daily  . Chlorhexidine Gluconate Cloth  6 each Topical Q0600  . docusate sodium  100 mg Oral Daily  . heparin  5,000 Units Subcutaneous Q8H  . hydrALAZINE  100 mg Oral Q8H  . [START ON 12/14/2019] influenza vac split quadrivalent PF  0.5 mL Intramuscular Tomorrow-1000  . labetalol  100 mg Oral BID  . lanthanum  1,000 mg Oral TID WC  . [START ON 12/14/2019] pneumococcal 23 valent vaccine  0.5 mL Intramuscular Tomorrow-1000  . polyethylene glycol  17 g Oral Daily   Continuous Infusions: . sodium chloride    . sodium chloride Stopped (12/12/19 1330)  .  clevidipine Stopped (12/11/19 1610)     LOS: 5 days    Time spent: 25 mins    Linkyn Gobin, MD [facility]   If 7PM-7AM, please contact night-coverage

## 2019-12-14 LAB — RENAL FUNCTION PANEL
Albumin: 3.2 g/dL — ABNORMAL LOW (ref 3.5–5.0)
Anion gap: 14 (ref 5–15)
BUN: 49 mg/dL — ABNORMAL HIGH (ref 6–20)
CO2: 26 mmol/L (ref 22–32)
Calcium: 9.4 mg/dL (ref 8.9–10.3)
Chloride: 98 mmol/L (ref 98–111)
Creatinine, Ser: 8.15 mg/dL — ABNORMAL HIGH (ref 0.61–1.24)
GFR, Estimated: 9 mL/min — ABNORMAL LOW (ref >60)
Glucose, Bld: 91 mg/dL (ref 70–99)
Phosphorus: 5.9 mg/dL — ABNORMAL HIGH (ref 2.5–4.6)
Potassium: 4.4 mmol/L (ref 3.5–5.1)
Sodium: 138 mmol/L (ref 135–145)

## 2019-12-14 LAB — HEMOGLOBIN AND HEMATOCRIT, BLOOD
HCT: 31.3 % — ABNORMAL LOW (ref 39.0–52.0)
Hemoglobin: 10.2 g/dL — ABNORMAL LOW (ref 13.0–17.0)

## 2019-12-14 LAB — PHOSPHORUS: Phosphorus: 6 mg/dL — ABNORMAL HIGH (ref 2.5–4.6)

## 2019-12-14 LAB — MAGNESIUM: Magnesium: 2.5 mg/dL — ABNORMAL HIGH (ref 1.7–2.4)

## 2019-12-14 MED ORDER — AMLODIPINE BESYLATE 10 MG PO TABS: 10.0000 mg | ORAL_TABLET | Freq: Every day | ORAL | 1 refills | Status: AC

## 2019-12-14 MED ORDER — HEPARIN SODIUM (PORCINE) 1000 UNIT/ML IJ SOLN
INTRAMUSCULAR | Status: AC
  Filled 2019-12-14: qty 1

## 2019-12-14 MED ORDER — LABETALOL HCL 100 MG PO TABS: 100.0000 mg | ORAL_TABLET | Freq: Two times a day (BID) | ORAL | 1 refills | Status: AC

## 2019-12-14 MED ORDER — HYDRALAZINE HCL 100 MG PO TABS: 100.0000 mg | ORAL_TABLET | Freq: Three times a day (TID) | ORAL | 1 refills | Status: AC

## 2019-12-14 NOTE — Plan of Care (Signed)
  Problem: Education: Goal: Knowledge of General Education information will improve Description: Including pain rating scale, medication(s)/side effects and non-pharmacologic comfort measures Outcome: Adequate for Discharge   Problem: Health Behavior/Discharge Planning: Goal: Ability to manage health-related needs will improve Outcome: Adequate for Discharge   Problem: Clinical Measurements: Goal: Ability to maintain clinical measurements within normal limits will improve Outcome: Adequate for Discharge Goal: Will remain free from infection Outcome: Adequate for Discharge Goal: Diagnostic test results will improve Outcome: Adequate for Discharge Goal: Cardiovascular complication will be avoided Outcome: Adequate for Discharge   Problem: Activity: Goal: Risk for activity intolerance will decrease Outcome: Adequate for Discharge   Problem: Coping: Goal: Level of anxiety will decrease Outcome: Adequate for Discharge   Problem: Elimination: Goal: Will not experience complications related to bowel motility Outcome: Adequate for Discharge Goal: Will not experience complications related to urinary retention Outcome: Adequate for Discharge   Problem: Pain Managment: Goal: General experience of comfort will improve Outcome: Adequate for Discharge   Problem: Safety: Goal: Ability to remain free from injury will improve Outcome: Adequate for Discharge   Problem: Skin Integrity: Goal: Risk for impaired skin integrity will decrease Outcome: Adequate for Discharge   Problem: Education: Goal: Knowledge of disease and its progression will improve Outcome: Adequate for Discharge   Problem: Health Behavior/Discharge Planning: Goal: Ability to manage health-related needs will improve Outcome: Adequate for Discharge   Problem: Clinical Measurements: Goal: Complications related to the disease process or treatment will be avoided or minimized Outcome: Adequate for Discharge Goal:  Dialysis access will remain free of complications Outcome: Adequate for Discharge   Problem: Activity: Goal: Activity intolerance will improve Outcome: Adequate for Discharge   Problem: Fluid Volume: Goal: Fluid volume balance will be maintained or improved Outcome: Adequate for Discharge   Problem: Nutritional: Goal: Ability to make appropriate dietary choices will improve Outcome: Adequate for Discharge   Problem: Respiratory: Goal: Respiratory symptoms related to disease process will be avoided Outcome: Adequate for Discharge   Problem: Urinary Elimination: Goal: Progression of disease will be identified and treated Outcome: Adequate for Discharge

## 2019-12-14 NOTE — Procedures (Signed)
I was present at this dialysis session. I have reviewed the session itself and made appropriate changes.   Vital signs in last 24 hours:  Temp:  [98.2 F (36.8 C)-98.9 F (37.2 C)] 98.7 F (37.1 C) (12/04 0704) Pulse Rate:  [82-100] 100 (12/04 0257) Resp:  [16-22] 22 (12/04 0704) BP: (139-173)/(64-100) 139/64 (12/04 0730) SpO2:  [100 %] 100 % (12/04 0257) Weight:  [98 kg-101.9 kg] 98.6 kg (12/04 0704) Weight change: 1.383 kg Filed Weights   12/13/19 1253 12/14/19 0257 12/14/19 0704  Weight: 101.2 kg 98 kg 98.6 kg    Recent Labs  Lab 12/14/19 0250  NA 138  K 4.4  CL 98  CO2 26  GLUCOSE 91  BUN 49*  CREATININE 8.15*  CALCIUM 9.4  PHOS 6.0*  5.9*    Recent Labs  Lab 12/07/19 1528 12/08/19 0727 12/10/19 0529 12/10/19 0529 12/12/19 0054 12/13/19 1410 12/14/19 0250  WBC 13.2*   < > 10.1  --  7.7 13.2*  --   NEUTROABS 10.7*  --   --   --   --   --   --   HGB 14.0   < > 11.2*   < > 10.2* 10.0* 10.2*  HCT 40.3   < > 34.3*   < > 31.0* 28.7* 31.3*  MCV 90.2   < > 94.2  --  93.9 92.9  --   PLT 171   < > 277  --  329 328  --    < > = values in this interval not displayed.    Scheduled Meds: . amLODipine  10 mg Oral Daily  . Chlorhexidine Gluconate Cloth  6 each Topical Q0600  . docusate sodium  100 mg Oral Daily  . heparin  5,000 Units Subcutaneous Q8H  . hydrALAZINE  100 mg Oral Q8H  . influenza vac split quadrivalent PF  0.5 mL Intramuscular Tomorrow-1000  . labetalol  100 mg Oral BID  . lanthanum  1,000 mg Oral TID WC  . pneumococcal 23 valent vaccine  0.5 mL Intramuscular Tomorrow-1000  . polyethylene glycol  17 g Oral Daily   Continuous Infusions: . sodium chloride    . sodium chloride Stopped (12/12/19 1330)  . clevidipine Stopped (12/11/19 1610)   PRN Meds:.Place/Maintain arterial line **AND** sodium chloride, ondansetron **OR** ondansetron (ZOFRAN) IV    Assessment/Plan:  1. New ESRD- has not been seen by his Nephrologist in over a year. His Scr  was 3.97 on 06/07/18. None in the interim and has progressed to ESRD.  1. Discussed the likelihood of requiring ongoing dialysisand he is amenable to start.  2. S/p R forearm AVF creation and LIJ TDC placement by Dr. Stanford Breed 12/12/19 3. Set up for outpatient dialysis at [facility] TTS second shift. 4. Discussed different modalities of RRT including incenter HD, home HD, and PD. He is not interested in home therapies at this time. 2. Hypertensive emergency- presented with headache and fatigue. Imaging studies with possible atypical PRES. BP markedly improved with clevidipine drip. Also on amlodipine 10 mg daily, hydralazine 100 mg tid. Lisinopril on hold due to AKI/CKD 1. BP's markedly improved 2. Renal artery duplex without evidence of RAS, UDS negative 3. Hyponatremia- improved 4. Metabolic acidosis- stable 5. Anemia of CKD- no indication for ESA with Hgb >10 at this time. 6. SHPTH-hyperphosphatemia and will start binders. iPTH 50 no vit D for now. 7. Hypokalemia- stable. 8. Disposition- OK for discharge to home after HD as he has outpatient dialysis  arranged at [facility] starting 12/17/19 at 11:30 am.  Donetta Potts,  MD 12/14/2019, 8:16 AM

## 2019-12-14 NOTE — Discharge Instructions (Signed)
Hypertension, Adult High blood pressure (hypertension) is when the force of blood pumping through the arteries is too strong. The arteries are the blood vessels that carry blood from the heart throughout the body. Hypertension forces the heart to work harder to pump blood and may cause arteries to become narrow or stiff. Untreated or uncontrolled hypertension can cause a heart attack, heart failure, a stroke, kidney disease, and other problems. A blood pressure reading consists of a higher number over a lower number. Ideally, your blood pressure should be below 120/80. The first ("top") number is called the systolic pressure. It is a measure of the pressure in your arteries as your heart beats. The second ("bottom") number is called the diastolic pressure. It is a measure of the pressure in your arteries as the heart relaxes. What are the causes? The exact cause of this condition is not known. There are some conditions that result in or are related to high blood pressure. What increases the risk? Some risk factors for high blood pressure are under your control. The following factors may make you more likely to develop this condition:  Smoking.  Having type 2 diabetes mellitus, high cholesterol, or both.  Not getting enough exercise or physical activity.  Being overweight.  Having too much fat, sugar, calories, or salt (sodium) in your diet.  Drinking too much alcohol. Some risk factors for high blood pressure may be difficult or impossible to change. Some of these factors include:  Having chronic kidney disease.  Having a family history of high blood pressure.  Age. Risk increases with age.  Race. You may be at higher risk if you are African American.  Gender. Men are at higher risk than women before age 45. After age 65, women are at higher risk than men.  Having obstructive sleep apnea.  Stress. What are the signs or symptoms? High blood pressure may not cause symptoms. Very high  blood pressure (hypertensive crisis) may cause:  Headache.  Anxiety.  Shortness of breath.  Nosebleed.  Nausea and vomiting.  Vision changes.  Severe chest pain.  Seizures. How is this diagnosed? This condition is diagnosed by measuring your blood pressure while you are seated, with your arm resting on a flat surface, your legs uncrossed, and your feet flat on the floor. The cuff of the blood pressure monitor will be placed directly against the skin of your upper arm at the level of your heart. It should be measured at least twice using the same arm. Certain conditions can cause a difference in blood pressure between your right and left arms. Certain factors can cause blood pressure readings to be lower or higher than normal for a short period of time:  When your blood pressure is higher when you are in a health care provider's office than when you are at home, this is called white coat hypertension. Most people with this condition do not need medicines.  When your blood pressure is higher at home than when you are in a health care provider's office, this is called masked hypertension. Most people with this condition may need medicines to control blood pressure. If you have a high blood pressure reading during one visit or you have normal blood pressure with other risk factors, you may be asked to:  Return on a different day to have your blood pressure checked again.  Monitor your blood pressure at home for 1 week or longer. If you are diagnosed with hypertension, you may have other blood or   imaging tests to help your health care provider understand your overall risk for other conditions. How is this treated? This condition is treated by making healthy lifestyle changes, such as eating healthy foods, exercising more, and reducing your alcohol intake. Your health care provider may prescribe medicine if lifestyle changes are not enough to get your blood pressure under control, and  if:  Your systolic blood pressure is above 130.  Your diastolic blood pressure is above 80. Your personal target blood pressure may vary depending on your medical conditions, your age, and other factors. Follow these instructions at home: Eating and drinking   Eat a diet that is high in fiber and potassium, and low in sodium, added sugar, and fat. An example eating plan is called the DASH (Dietary Approaches to Stop Hypertension) diet. To eat this way: ? Eat plenty of fresh fruits and vegetables. Try to fill one half of your plate at each meal with fruits and vegetables. ? Eat whole grains, such as whole-wheat pasta, brown rice, or whole-grain bread. Fill about one fourth of your plate with whole grains. ? Eat or drink low-fat dairy products, such as skim milk or low-fat yogurt. ? Avoid fatty cuts of meat, processed or cured meats, and poultry with skin. Fill about one fourth of your plate with lean proteins, such as fish, chicken without skin, beans, eggs, or tofu. ? Avoid pre-made and processed foods. These tend to be higher in sodium, added sugar, and fat.  Reduce your daily sodium intake. Most people with hypertension should eat less than 1,500 mg of sodium a day.  Do not drink alcohol if: ? Your health care provider tells you not to drink. ? You are pregnant, may be pregnant, or are planning to become pregnant.  If you drink alcohol: ? Limit how much you use to:  0-1 drink a day for women.  0-2 drinks a day for men. ? Be aware of how much alcohol is in your drink. In the [place] one drink equals one 12 oz bottle of beer (355 mL), one 5 oz glass of wine (148 mL), or one 1 oz glass of hard liquor (44 mL). Lifestyle   Work with your health care provider to maintain a healthy body weight or to lose weight. Ask what an ideal weight is for you.  Get at least 30 minutes of exercise most days of the week. Activities may include walking, swimming, or biking.  Include exercise to  strengthen your muscles (resistance exercise), such as Pilates or lifting weights, as part of your weekly exercise routine. Try to do these types of exercises for 30 minutes at least 3 days a week.  Do not use any products that contain nicotine or tobacco, such as cigarettes, e-cigarettes, and chewing tobacco. If you need help quitting, ask your health care provider.  Monitor your blood pressure at home as told by your health care provider.  Keep all follow-up visits as told by your health care provider. This is important. Medicines  Take over-the-counter and prescription medicines only as told by your health care provider. Follow directions carefully. Blood pressure medicines must be taken as prescribed.  Do not skip doses of blood pressure medicine. Doing this puts you at risk for problems and can make the medicine less effective.  Ask your health care provider about side effects or reactions to medicines that you should watch for. Contact a health care provider if you:  Think you are having a reaction to a medicine you   are taking.  Have headaches that keep coming back (recurring).  Feel dizzy.  Have swelling in your ankles.  Have trouble with your vision. Get help right away if you:  Develop a severe headache or confusion.  Have unusual weakness or numbness.  Feel faint.  Have severe pain in your chest or abdomen.  Vomit repeatedly.  Have trouble breathing. Summary  Hypertension is when the force of blood pumping through your arteries is too strong. If this condition is not controlled, it may put you at risk for serious complications.  Your personal target blood pressure may vary depending on your medical conditions, your age, and other factors. For most people, a normal blood pressure is less than 120/80.  Hypertension is treated with lifestyle changes, medicines, or a combination of both. Lifestyle changes include losing weight, eating a healthy, low-sodium diet,  exercising more, and limiting alcohol. This information is not intended to replace advice given to you by your health care provider. Make sure you discuss any questions you have with your health care provider. Document Revised: 09/06/2017 Document Reviewed: 09/06/2017 Elsevier Patient Education  2020 [employer].   [facility]    Dialysis Dialysis is a procedure that is done when the kidneys have stopped working properly (kidney failure). It may also be done earlier if it may help improve symptoms. During dialysis, wastes, salt, and extra water are removed from the blood, and the levels of certain minerals in the blood are maintained. Dialysis is done in sessions which are continued until the kidneys get better. If the kidneys cannot get better, such as in end-stage kidney disease, dialysis is continued for life or until you receive a new kidney from a donor (kidney transplant). There are two types of dialysis: hemodialysis and peritoneal dialysis. What is hemodialysis?        Hemodialysis is when a machine called a dialyzer is used to filter the blood. Before starting hemodialysis, you will have surgery to create a site where blood can be removed from the body and returned to the body (vascular access). There are three types of vascular accesses:  Arteriovenous fistula. This type of access is created when an artery and a vein (usually in the arm) are connected during surgery. The arteriovenous fistula usually takes 1-6 months to develop after surgery. It may last longer than the other types of vascular accesses and is less likely to become infected or cause blood clots.  Arteriovenous graft. This type of access is created when an artery and a vein in the arm are connected during surgery with a tube. An arteriovenous graft can usually be used within 2-3 weeks of surgery.  A venous catheter. To create this type of access, a thin tube (catheter) is placed in a  large vein in your neck, chest, or groin. A venous catheter can be used right away. It is usually used as a temporary access when dialysis needs to begin immediately. During hemodialysis, blood leaves your body through your access site. It travels through a tube to the dialyzer, where it is filtered. The blood then returns to your body through another tube. Hemodialysis is usually done at a hospital or dialysis center three times a week. Visits last about 3-5 hours. With special training, it may also be done at home with the help of another person. What is peritoneal dialysis? Peritoneal dialysis is when the thin lining of the abdomen (peritoneum) and a fluid called dialysate are used to filter the  blood. Before starting peritoneal dialysis, you will have surgery to place a catheter in your abdomen. The catheter will be used to transfer dialysate to and from your abdomen. At the start of a session, your abdomen is filled with dialysate. During the session, wastes, salt, and extra water in the blood pass through the peritoneum and into the dialysate. The dialysate is drained from the body at the end of the session. The process of filling and draining the dialysate is called an exchange. Exchanges are repeated until you have used up all the dialysate for the day. You may do peritoneal dialysis at home or at almost any other location. It is done every day. You may need up to five exchanges a day. Each exchange takes about 30-40 minutes. The amount of time the dialysate is in your body between exchanges is called a dwell. The dwell usually lasts 1.5-3 hours and can vary with each person. You may choose to do exchanges at night while you sleep, using a machine called a cycler. Which type of dialysis should I choose? Both types of dialysis have advantages and disadvantages. Talk with your health care provider about which type of dialysis is best for you. Your lifestyle, preferences, and medical condition should be  considered. In some cases, only one type of dialysis can be chosen. Advantages of hemodialysis  It is done less often than peritoneal dialysis.  Someone else can do the dialysis for you.  If you go to a dialysis center: ? Your health care provider can recognize any problems you may be having. ? You can interact with others who are having dialysis. This can provide you with emotional support. Disadvantages of hemodialysis  Hemodialysis may cause cramps and low blood pressure. It may leave you feeling tired on the days you have the treatment.  If you go to a dialysis center, you will need to make weekly appointments and work around the center's schedule.  You will need to take extra care when traveling. If you usually get treatment in a dialysis center, you will need to arrange to visit a dialysis center near your destination. If you are having treatments at home, you will need to take the dialyzer with you when traveling.  There are more eating restrictions than with peritoneal dialysis. Advantages of peritoneal dialysis  It is less likely than hemodialysis to cause cramps and low blood pressure.  There are fewer eating restrictions than with hemodialysis.  You may do exchanges on your own wherever you are, including when you travel. Disadvantages of peritoneal dialysis  It is done more often than hemodialysis.  Doing peritoneal dialysis requires you to have a good use (dexterity) of your hands. You must also be able to lift bags.  You must learn how to make your equipment free of germs (sterilization techniques). You will need to use these techniques every day to prevent infection. What changes will I need to make to my diet during dialysis? Both types of dialysis require you to make some changes to your diet. For example, you will need to limit your intake of foods that contain a lot of phosphorus and potassium. You will also need to limit your fluid intake. A diet and nutrition  specialist (dietitian) can help you make a meal plan that can help improve your dialysis and your health. What should I expect when starting dialysis? Adjusting to the dialysis treatment, schedule, and diet can take some time. You may need to stop working and may not  be able to do some of your normal activities. You may feel anxious or depressed when starting dialysis. Over time, many people feel better overall because of dialysis. You may be able to return to work after making some changes, such as reducing work intensity. Where to find more information  [place]: www.kidney.org  American Association of Kidney Patients: BombTimer.gl  American Kid[employer] Fund: www.kidneyfund.org Summary  During dialysis, wastes, salt, and extra water are removed from the blood, and the levels of certain minerals in the blood are maintained. There are two types of dialysis: hemodialysis and peritoneal dialysis.  Hemodialysis is when a machine called a dialyzer is used to filter the blood.  Hemodialysis is usually done by a health care provider at a hospital or dialysis center three times a week.  Peritoneal dialysis is when the peritoneum is used as a filter. You may do peritoneal dialysis at home or at almost any other location.  Both types of dialysis have advantages and disadvantages. Talk with your health care provider about which type of dialysis is best for you. This information is not intended to replace advice given to you by your health care provider. Make sure you discuss any questions you have with your health care provider. Document Revised: 05/15/2018 Document Reviewed: 02/23/2016 Elsevier Patient Education  [place]. Discharge Instructions  AV Fistula or Graft Surgery for Dialysis Access  Please refer to the following instructions for your post-procedure care. Your surgeon or physician assistant will discuss any changes with you.  Activity  You may drive the day  following your surgery, if you are comfortable and no longer taking prescription pain medication. Resume full activity as the soreness in your incision resolves.  Bathing/Showering  You may shower after you go home. Keep your incision dry for 48 hours. Do not soak in a bathtub, hot tub, or swim until the incision heals completely. You may not shower if you have a hemodialysis catheter.  Incision Care  Clean your incision with mild soap and water after 48 hours. Pat the area dry with a clean towel. You do not need a bandage unless otherwise instructed. Do not apply any ointments or creams to your incision. You may have skin glue on your incision. Do not peel it off. It will come off on its own in about one week. Your arm may swell a bit after surgery. To reduce swelling use pillows to elevate your arm so it is above your heart. Your doctor will tell you if you need to lightly wrap your arm with an ACE bandage.  Diet  Resume your normal diet. There are not special food restrictions following this procedure. In order to heal from your surgery, it is CRITICAL to get adequate nutrition. Your body requires vitamins, minerals, and protein. Vegetables are the best source of vitamins and minerals. Vegetables also provide the perfect balance of protein. Processed food has little nutritional value, so try to avoid this.  Medications  Resume taking all of your medications. If your incision is causing pain, you may take over-the counter pain relievers such as acetaminophen (Tylenol). If you were prescribed a stronger pain medication, please be aware these medications can cause nausea and constipation. Prevent nausea by taking the medication with a snack or meal. Avoid constipation by drinking plenty of fluids and eating foods with high amount of fiber, such as fruits, vegetables, and grains. Do not take Tylenol if you are taking prescription pain medications.     Follow up  Your surgeon may want to see you  in the office following your access surgery. If so, this will be arranged at the time of your surgery.  Please call us immediately for any of the following conditions:  Increased pain, redness, drainage (pus) from your incision site Fever of 101 degrees or higher Severe or worsening pain at your incision site Hand pain or numbness.  Reduce your risk of vascular disease:  Stop smoking. If you would like help, call [facility] at 1-800-QUIT-NOW 319-446-5943) or [place] at [place] your cholesterol Maintain a desired weight Control your diabetes Keep your blood pressure down  Dialysis  It will take several weeks to several months for your new dialysis access to be ready for use. Your surgeon will determine when it is OK to use it. Your nephrologist will continue to direct your dialysis. You can continue to use your Permcath until your new access is ready for use.  If you have any questions, please call the office at [phone].

## 2019-12-14 NOTE — Discharge Summary (Signed)
Physician Discharge Summary  Robert Conner PXT:[id number] DOB: 1995-11-16 DOA: 12/07/2019  PCP: Patient, No Pcp Per  Admit date: 12/07/2019   Discharge date: 12/14/2019  Admitted From: Home.  Disposition:  Home  Recommendations for Outpatient Follow-up:  1. Follow up with PCP in 1-2 weeks. 2. Please obtain BMP/CBC in one week. 3. Advised to continue hemodialysis outside TTS schedule,  appointment has been set up. 4. Advised to take blood pressure medications as advised. 5. Advised to follow-up with ophthalmology after discharge.  Home Health: None Equipment/Devices:None Discharge Condition: Stable CODE STATUS:Full code Diet recommendation: Heart Healthy   Brief Summary / Hospital course: This 24 years old male with past medical history of hypertension with noncompliance presented in the [facility] emergency room with complaints of headache and blurry vision,  found to have hypertensive emergency.  He was referred by ophthalmology office where he was found to have papilledema in both eyes. He was found to have blood pressure of 256/172 in the ED.  He was started on Cleviprex drip,  CT head was without any acute findings.  Serum creatinine was found to be elevated at 10.0 , His baseline creatinine in 2019 was 3.2  He has not followed up with any primary care physician afterwards. Patient was admitted initially in the ICU requiring Cleviprex drip. Nephrology consulted recommended hemodialysis during this hospitalization.  Patient renal function did not recover.  Vascular surgery was consulted,  He underwent Right arm AV fistula and tunneled catheter placement on 12/2 followed by hemodialysis on 12/3.  Patient has hemodialysis 2 days in a row which improved his renal function.  Patient will need ongoing hemodialysis.  Outpatient hemodialysis set up has been arranged.  Patient feels better and wants to be discharged.  Nephrology signed off patient is being discharged home.  Medication compliance  emphasized in detail.  He was managed for below problems during hospitalization.  Discharge Diagnoses:  Principal Problem:   Hypertensive emergency Active Problems:   Papilledema   AKI (acute kidney injury) ([place])  Hypertensive Emergency- Resolved. He was found to have blood pressure 256 /172 in the ED.   He was referred by ophthalmologist after being found to have bilateral papilledema . Patient was started on Cleviprex , BP decreasing on Cleviprex, hydralazine, and amlodipine.  Blood pressure improved 145/85 He is off Cleviprex 12/1 Continue amlodipine 10 mg daily,hydralazine 100 mg 3 times dailyand labetalol 100mg  BID No renal artery stenosis on [place], urine drug screen negative, TSH normal pending urine metanephrines and catecholamines, aldosterone, rennin for secondary hypertension He needs ophthalmology follow up once discharged  AKI/CKD stage IV -Nephrology recommending starting HD during his admission.  -Vascular surgery consulted. Patient underwent right arm radiocephalic AV fistula, left internal jugular dialysis catheter. -Patient has hemodialysis 2 days in a row which improves renal function slightly. -Patient will need ongoing hemodialysis,  outpatient hemodialysis scheduling has been done. -Case management working on  outpatient hemodialysis schedule.  Discharge Instructions  Discharge Instructions    Call MD for:  difficulty breathing, headache or visual disturbances   Complete by: As directed    Call MD for:  persistant dizziness or light-headedness   Complete by: As directed    Call MD for:  persistant nausea and vomiting   Complete by: As directed    Call MD for:  temperature >100.4   Complete by: As directed    Diet - low sodium heart healthy   Complete by: As directed    Discharge instructions   Complete by:  As directed    Advised to follow-up with primary care physician in 1 week. Advised to continue hemodialysis outside appointment has been set  up. Advised to take blood pressure medications as advised.   Increase activity slowly   Complete by: As directed    No wound care   Complete by: As directed      Allergies as of 12/14/2019      Reactions   Other Swelling   Tree nuts      Medication List    STOP taking these medications   hydrochlorothiazide 25 MG tablet Commonly known as: HYDRODIURIL   lisinopril 20 MG tablet Commonly known as: ZESTRIL     TAKE these medications   allopurinol 300 MG tablet Commonly known as: ZYLOPRIM Take 300 mg by mouth daily.   amLODipine 10 MG tablet Commonly known as: NORVASC Take 1 tablet (10 mg total) by mouth daily. Start taking on: December 15, 2019   hydrALAZINE 100 MG tablet Commonly known as: APRESOLINE Take 1 tablet (100 mg total) by mouth every 8 (eight) hours.   labetalol 100 MG tablet Commonly known as: NORMODYNE Take 1 tablet (100 mg total) by mouth 2 (two) times daily.       Follow-up Information    [facility] Follow up in 5 week(s).   Specialty: Vascular Surgery Contact information: [place] Follow up in 2 day(s).              Allergies  Allergen Reactions  . Other Swelling    Tree nuts    Consultations:  Nephrology  Vascular surgery   Procedures/Studies: CT Head Wo Contrast  Result Date: 12/07/2019 CLINICAL DATA:  Headache, blurred vision, papilledema, hypertension EXAM: CT HEAD WITHOUT CONTRAST TECHNIQUE: Contiguous axial images were obtained from the base of the skull through the vertex without intravenous contrast. COMPARISON:  04/21/2017 FINDINGS: Brain: No evidence of acute infarction, hemorrhage, hydrocephalus, extra-axial collection or mass lesion/mass effect. Vascular: No hyperdense vessel or unexpected calcification. Skull: Normal. Negative for fracture or focal lesion. Sinuses/Orbits: Paranasal sinuses and mastoid air  cells are clear. Orbits and optic nerves appear within normal limits. Other: None. IMPRESSION: No acute intracranial findings by CT. Electronically Signed   By: Davina Poke D.O.   On: 12/07/2019 16:25   MR ANGIO HEAD WO CONTRAST  Result Date: 12/08/2019 CLINICAL DATA:  Initial evaluation for acute papilledema. EXAM: MRI ORBITS WITHOUT CONTRAST MRA HEAD WITHOUT CONTRAST MRV HEAD WITHOUT CONTRAST TECHNIQUE: Multiplanar, multiecho pulse sequences of the brain and surrounding structures were obtained without intravenous contrast. Angiographic images of the intracranial venous structures were obtained using MRV technique without intravenous contrast. Angiographic images of the intracranial arterial structures were obtained using MRA technique without intravenous contrast. COMPARISON:  None. FINDINGS: MRI HEAD AND ORBITS WITHOUT CONTRAST Brain: Mildly advanced cerebral atrophy for age. Scattered patchy T2/FLAIR hyperintensity within the periventricular and deep white matter both cerebral hemispheres, nonspecific, but suspected to reflect small vessel ischemic changes, advanced for age. There is diffuse abnormal T2/FLAIR signal abnormality seen throughout the brainstem, with involvement of the midbrain, pons, and medulla. Extension into the left greater than right cerebral peduncles towards the posterior limbs of both internal capsules. Extensive patchy involvement throughout the cerebellum as well, fairly diffuse and symmetric in nature. Associated diffuse swelling and edema throughout the areas affected, with secondary crowding about the basilar cisterns. Fourth ventricle is partially effaced and  slit-like. Slightly increased ventricular dilatation of the lateral and third ventricles as compared to previous head CT from 04/21/2017, suggesting mild hydrocephalus. Thin rim of FLAIR hyperintensity about the lateral ventricles suspected to reflect a degree of transependymal flow of CSF, most pronounced at the left  temporal pole (series 15, image 9). No frank herniation of the cerebellar tonsils through the foramen magnum at this time. Associated minimal susceptibility artifact at the right pontine region suggestive of associated minimal petechial hemorrhage (series 18, image 14). No diffusion abnormality seen elsewhere within the brain to suggest acute or subacute ischemia. Gray-white matter differentiation otherwise maintained. No encephalomalacia to suggest chronic cortical infarction. No other foci of susceptibility artifact to suggest acute or chronic intracranial hemorrhage. No mass lesion or midline shift. No extra-axial fluid collection. Pituitary gland and suprasellar region within normal limits. Midline structures intact. Vascular: Major intracranial vascular flow voids are maintained. Skull and upper cervical spine: Mild crowding at the craniocervical junction without frank transtentorial herniation. Visualized upper cervical spine within normal limits. Bone marrow signal intensity normal. No scalp soft tissue abnormality. Sinuses/Orbits: Globes are symmetric in size. Mild flattening at the posterior globes with subtle bulging of the optic nerve discs and increased CSF along the optic nerve sheaths, suggesting papilledema, and indicating elevated intracranial pressures. Changes most pronounced at the posterior right globe (series 14, image 9). No abnormality about the orbital apices, cavernous sinus, or suprasellar cistern. Optic chiasm normally situated. Superior orbital veins symmetric and within normal limits. Intraconal and extraconal fat well-maintained. Extra-ocular muscles symmetric and normal. Lacrimal glands normal. Mild scattered mucosal thickening noted within the ethmoidal air cells. Small right sphenoid sinus retention cyst. Paranasal sinuses are otherwise clear. Other: No mastoid effusion.  Inner ear structures grossly normal. MR VENOGRAM WITHOUT CONTRAST Examination somewhat technically limited by  patient positioning and rotation. Normal flow related signal seen throughout the anterior and mid superior sagittal sinus. Focal signal loss at the posterior aspect of the superior sagittal sinus could reflect a focal stenosis (series 1031, image 10). No abnormal T1 hyperintensity, FLAIR hyperintensity, or susceptibility artifacts seen at this location to suggest focal thrombus. Superior sagittal sinus otherwise patent distally to the torcula. Torcula itself is patent. Normal flow related signal seen within the transverse and sigmoid sinuses as well as the partially visualized proximal internal jugular veins. Right transverse sinus dominant. Straight sinus appears grossly patent at its origin. Straight sinus, vein of Galen, and internal cerebral veins not well seen distally, felt to be most likely artifactual and technical due to patient positioning. Normal flow voids are seen within the partially visualized straight sinus, vein of Galen, internal cerebral veins, and basal veins of Rosenthal on corresponding T2 weighted sequences. No convincing imaging findings to suggest dural venous thrombosis. MRA HEAD WITHOUT CONTRAST ANTERIOR CIRCULATION: Visualized distal cervical segments of the internal carotid arteries are widely patent with symmetric antegrade flow. Petrous, cavernous, and supraclinoid segments widely patent without stenosis or other abnormality. A1 segments widely patent. Normal anterior communicating artery complex. Anterior cerebral arteries patent to their distal aspects without stenosis. No M1 stenosis or occlusion. Normal MCA bifurcations. Distal MCA branches well perfused and symmetric. POSTERIOR CIRCULATION: Both vertebral arteries widely patent to the vertebrobasilar junction without stenosis. Neither PICA well visualized. Basilar patent to its distal aspect without stenosis. Superior cerebral arteries patent bilaterally. Both PCA supplied via the basilar as well as small bilateral posterior  communicating arteries. Both PCAs well perfused to their distal aspects. IMPRESSION: MRI HEAD IMPRESSION: 1. Diffuse abnormal  T2/FLAIR signal abnormality throughout the brainstem and cerebellum, with extension into the left greater than right cerebral peduncles. Findings are nonspecific, with primary differential considerations including changes related to acute hypertensive encephalopathy/atypical PRES or possibly toxic metabolic derangement. Correlation with history and laboratory values recommended. 2. Associated diffuse swelling and edema throughout the areas affected, with secondary crowding about the basilar cisterns, and partial effacement of the fourth ventricle. Slightly increased ventricular dilatation of the lateral and third ventricles suggesting secondary mild hydrocephalus. Thin rim of FLAIR hyperintensity about the lateral ventricles suspected to reflect a degree of transependymal flow of CSF. 3. Associated papilledema at the posterior globes, consistent with elevated ICP. MRA HEAD IMPRESSION: Normal intracranial MRA MRV HEAD IMPRESSION: 1. No convincing findings to suggest dural venous thrombosis. 2. Focal signal loss at the posterior aspect of the superior sagittal sinus, which could reflect a focal stenosis. 3. Non visualization of the straight sinus and majority of the deep venous system, felt to be technical in artifactual nature on this exam due to patient positioning. Normal flow voids are seen within the partially visualized straight sinus, vein of Galen, and internal cerebral veins on corresponding brain MRI. Results were discussed by telephone at the time of interpretation on 12/08/2019 at 5:19 am to provider Dr. Gillermina Phy, who verbally acknowledged these results. Electronically Signed   By: Jeannine Boga M.D.   On: 12/08/2019 05:20   MR Venogram Head  Result Date: 12/08/2019 CLINICAL DATA:  Initial evaluation for acute papilledema. EXAM: MRI ORBITS WITHOUT CONTRAST MRA HEAD  WITHOUT CONTRAST MRV HEAD WITHOUT CONTRAST TECHNIQUE: Multiplanar, multiecho pulse sequences of the brain and surrounding structures were obtained without intravenous contrast. Angiographic images of the intracranial venous structures were obtained using MRV technique without intravenous contrast. Angiographic images of the intracranial arterial structures were obtained using MRA technique without intravenous contrast. COMPARISON:  None. FINDINGS: MRI HEAD AND ORBITS WITHOUT CONTRAST Brain: Mildly advanced cerebral atrophy for age. Scattered patchy T2/FLAIR hyperintensity within the periventricular and deep white matter both cerebral hemispheres, nonspecific, but suspected to reflect small vessel ischemic changes, advanced for age. There is diffuse abnormal T2/FLAIR signal abnormality seen throughout the brainstem, with involvement of the midbrain, pons, and medulla. Extension into the left greater than right cerebral peduncles towards the posterior limbs of both internal capsules. Extensive patchy involvement throughout the cerebellum as well, fairly diffuse and symmetric in nature. Associated diffuse swelling and edema throughout the areas affected, with secondary crowding about the basilar cisterns. Fourth ventricle is partially effaced and slit-like. Slightly increased ventricular dilatation of the lateral and third ventricles as compared to previous head CT from 04/21/2017, suggesting mild hydrocephalus. Thin rim of FLAIR hyperintensity about the lateral ventricles suspected to reflect a degree of transependymal flow of CSF, most pronounced at the left temporal pole (series 15, image 9). No frank herniation of the cerebellar tonsils through the foramen magnum at this time. Associated minimal susceptibility artifact at the right pontine region suggestive of associated minimal petechial hemorrhage (series 18, image 14). No diffusion abnormality seen elsewhere within the brain to suggest acute or subacute  ischemia. Gray-white matter differentiation otherwise maintained. No encephalomalacia to suggest chronic cortical infarction. No other foci of susceptibility artifact to suggest acute or chronic intracranial hemorrhage. No mass lesion or midline shift. No extra-axial fluid collection. Pituitary gland and suprasellar region within normal limits. Midline structures intact. Vascular: Major intracranial vascular flow voids are maintained. Skull and upper cervical spine: Mild crowding at the craniocervical junction without frank transtentorial herniation. Visualized  upper cervical spine within normal limits. Bone marrow signal intensity normal. No scalp soft tissue abnormality. Sinuses/Orbits: Globes are symmetric in size. Mild flattening at the posterior globes with subtle bulging of the optic nerve discs and increased CSF along the optic nerve sheaths, suggesting papilledema, and indicating elevated intracranial pressures. Changes most pronounced at the posterior right globe (series 14, image 9). No abnormality about the orbital apices, cavernous sinus, or suprasellar cistern. Optic chiasm normally situated. Superior orbital veins symmetric and within normal limits. Intraconal and extraconal fat well-maintained. Extra-ocular muscles symmetric and normal. Lacrimal glands normal. Mild scattered mucosal thickening noted within the ethmoidal air cells. Small right sphenoid sinus retention cyst. Paranasal sinuses are otherwise clear. Other: No mastoid effusion.  Inner ear structures grossly normal. MR VENOGRAM WITHOUT CONTRAST Examination somewhat technically limited by patient positioning and rotation. Normal flow related signal seen throughout the anterior and mid superior sagittal sinus. Focal signal loss at the posterior aspect of the superior sagittal sinus could reflect a focal stenosis (series 1031, image 10). No abnormal T1 hyperintensity, FLAIR hyperintensity, or susceptibility artifacts seen at this location to  suggest focal thrombus. Superior sagittal sinus otherwise patent distally to the torcula. Torcula itself is patent. Normal flow related signal seen within the transverse and sigmoid sinuses as well as the partially visualized proximal internal jugular veins. Right transverse sinus dominant. Straight sinus appears grossly patent at its origin. Straight sinus, vein of Galen, and internal cerebral veins not well seen distally, felt to be most likely artifactual and technical due to patient positioning. Normal flow voids are seen within the partially visualized straight sinus, vein of Galen, internal cerebral veins, and basal veins of Rosenthal on corresponding T2 weighted sequences. No convincing imaging findings to suggest dural venous thrombosis. MRA HEAD WITHOUT CONTRAST ANTERIOR CIRCULATION: Visualized distal cervical segments of the internal carotid arteries are widely patent with symmetric antegrade flow. Petrous, cavernous, and supraclinoid segments widely patent without stenosis or other abnormality. A1 segments widely patent. Normal anterior communicating artery complex. Anterior cerebral arteries patent to their distal aspects without stenosis. No M1 stenosis or occlusion. Normal MCA bifurcations. Distal MCA branches well perfused and symmetric. POSTERIOR CIRCULATION: Both vertebral arteries widely patent to the vertebrobasilar junction without stenosis. Neither PICA well visualized. Basilar patent to its distal aspect without stenosis. Superior cerebral arteries patent bilaterally. Both PCA supplied via the basilar as well as small bilateral posterior communicating arteries. Both PCAs well perfused to their distal aspects. IMPRESSION: MRI HEAD IMPRESSION: 1. Diffuse abnormal T2/FLAIR signal abnormality throughout the brainstem and cerebellum, with extension into the left greater than right cerebral peduncles. Findings are nonspecific, with primary differential considerations including changes related to  acute hypertensive encephalopathy/atypical PRES or possibly toxic metabolic derangement. Correlation with history and laboratory values recommended. 2. Associated diffuse swelling and edema throughout the areas affected, with secondary crowding about the basilar cisterns, and partial effacement of the fourth ventricle. Slightly increased ventricular dilatation of the lateral and third ventricles suggesting secondary mild hydrocephalus. Thin rim of FLAIR hyperintensity about the lateral ventricles suspected to reflect a degree of transependymal flow of CSF. 3. Associated papilledema at the posterior globes, consistent with elevated ICP. MRA HEAD IMPRESSION: Normal intracranial MRA MRV HEAD IMPRESSION: 1. No convincing findings to suggest dural venous thrombosis. 2. Focal signal loss at the posterior aspect of the superior sagittal sinus, which could reflect a focal stenosis. 3. Non visualization of the straight sinus and majority of the deep venous system, felt to be technical in artifactual  nature on this exam due to patient positioning. Normal flow voids are seen within the partially visualized straight sinus, vein of Galen, and internal cerebral veins on corresponding brain MRI. Results were discussed by telephone at the time of interpretation on 12/08/2019 at 5:19 am to provider Dr. Gillermina Phy, who verbally acknowledged these results. Electronically Signed   By: Jeannine Boga M.D.   On: 12/08/2019 05:20   DG CHEST PORT 1 VIEW  Result Date: 12/12/2019 CLINICAL DATA:  Central catheter placement EXAM: PORTABLE CHEST 1 VIEW COMPARISON:  April 21, 2017 FINDINGS: Central catheter tip is at the cavoatrial junction. No pneumothorax. Lungs are clear. Heart is mildly enlarged with pulmonary vascularity normal. No adenopathy. No bone lesions. IMPRESSION: Central catheter tip at cavoatrial junction. No pneumothorax. Mild cardiomegaly. Lungs clear. Electronically Signed   By: Lowella Grip III M.D.   On:  12/12/2019 12:21   DG Fluoro Guide CV Line-No Report  Result Date: 12/12/2019 Fluoroscopy was utilized by the requesting physician.  No radiographic interpretation.   VAS [place] UPPER EXT VEIN MAPPING (PRE-OP AVF)  Result Date: 12/11/2019 UPPER EXTREMITY VEIN MAPPING  Indications: Pre-access. History: End stage renal disease.  Limitations: Bandaging- PICC line LT Comparison Study: No prior studies. Performing Technologist: Darlin Coco, RDMS  Examination Guidelines: A complete evaluation includes B-mode imaging, spectral Doppler, color Doppler, and power Doppler as needed of all accessible portions of each vessel. Bilateral testing is considered an integral part of a complete examination. Limited examinations for reoccurring indications may be performed as noted. +-----------------+-------------+----------+---------+ Right Cephalic   Diameter (cm)Depth (cm)Findings  +-----------------+-------------+----------+---------+ Shoulder             0.52        0.45             +-----------------+-------------+----------+---------+ Prox upper arm       0.54        0.83   branching +-----------------+-------------+----------+---------+ Mid upper arm        0.45        0.30             +-----------------+-------------+----------+---------+ Dist upper arm       0.57        0.47             +-----------------+-------------+----------+---------+ Antecubital fossa    0.68        0.41             +-----------------+-------------+----------+---------+ Prox forearm         0.44        0.47             +-----------------+-------------+----------+---------+ Mid forearm          0.40        0.41             +-----------------+-------------+----------+---------+ Dist forearm         0.38        0.29             +-----------------+-------------+----------+---------+ Wrist                0.36        0.29             +-----------------+-------------+----------+---------+  +-----------------+-------------+----------+---------+ Right Basilic    Diameter (cm)Depth (cm)Findings  +-----------------+-------------+----------+---------+ Mid upper arm        0.59                         +-----------------+-------------+----------+---------+  Dist upper arm       0.62               branching +-----------------+-------------+----------+---------+ Antecubital fossa    0.52               branching +-----------------+-------------+----------+---------+ Prox forearm         0.40               branching +-----------------+-------------+----------+---------+ Mid forearm          0.37                         +-----------------+-------------+----------+---------+ Distal forearm       0.38                         +-----------------+-------------+----------+---------+ Wrist                0.24               branching +-----------------+-------------+----------+---------+ +-----------------+-------------+----------+--------------+ Left Cephalic    Diameter (cm)Depth (cm)   Findings    +-----------------+-------------+----------+--------------+ Shoulder             0.43        0.95                  +-----------------+-------------+----------+--------------+ Prox upper arm       0.37        0.98                  +-----------------+-------------+----------+--------------+ Mid upper arm        0.38        0.40                  +-----------------+-------------+----------+--------------+ Dist upper arm       0.43        0.42                  +-----------------+-------------+----------+--------------+ Antecubital fossa                       not visualized +-----------------+-------------+----------+--------------+ Prox forearm                            not visualized +-----------------+-------------+----------+--------------+ Mid forearm          0.39        0.59                   +-----------------+-------------+----------+--------------+ Dist forearm         0.42        0.42                  +-----------------+-------------+----------+--------------+ Wrist                                   not visualized +-----------------+-------------+----------+--------------+ *See table(s) above for measurements and observations.  Diagnosing physician: Harold Barban MD Electronically signed by Harold Barban MD on 12/11/2019 at 8:55:33 PM.    Final    VAS US RENAL ARTERY DUPLEX  Result Date: 12/09/2019 ABDOMINAL VISCERAL Indications: Severe resistant hypertension. Comparison Study: No prior. Performing Technologist: Oda Cogan RDMS, RVT  Examination Guidelines: A complete evaluation includes B-mode imaging, spectral Doppler, color Doppler, and power Doppler as needed of all accessible portions of each vessel. Bilateral testing  is considered an integral part of a complete examination. Limited examinations for reoccurring indications may be performed as noted.  Duplex Findings: +--------------------+--------+--------+------+--------+ Mesenteric          PSV cm/sEDV cm/sPlaqueComments +--------------------+--------+--------+------+--------+ Aorta Prox             96                          +--------------------+--------+--------+------+--------+ Celiac Artery Origin  161                          +--------------------+--------+--------+------+--------+ SMA Proximal          245                          +--------------------+--------+--------+------+--------+    +------------------+--------+--------+-------+ Right Renal ArteryPSV cm/sEDV cm/sComment +------------------+--------+--------+-------+ Origin              128      75           +------------------+--------+--------+-------+ Proximal            115      22           +------------------+--------+--------+-------+ Mid                  68      17            +------------------+--------+--------+-------+ Distal               69      21           +------------------+--------+--------+-------+ +-----------------+--------+--------+-------+ Left Renal ArteryPSV cm/sEDV cm/sComment +-----------------+--------+--------+-------+ Origin              75      13           +-----------------+--------+--------+-------+ Proximal            83      16           +-----------------+--------+--------+-------+ Mid                 85      21           +-----------------+--------+--------+-------+ Distal              85      21           +-----------------+--------+--------+-------+ +------------+--------+--------+----+-----------+--------+--------+----+ Right KidneyPSV cm/sEDV cm/sRI  Left KidneyPSV cm/sEDV cm/sRI   +------------+--------+--------+----+-----------+--------+--------+----+ Upper Pole  18      7       0.60Upper Pole 65      9       0.80 +------------+--------+--------+----+-----------+--------+--------+----+ Mid         27      6       0.71Mid        27      10      0.62 +------------+--------+--------+----+-----------+--------+--------+----+ Lower Pole  24      6       0.73Lower Pole 31      9       0.71 +------------+--------+--------+----+-----------+--------+--------+----+ Hilar       29      8       0.72Hilar      37      11      0.71 +------------+--------+--------+----+-----------+--------+--------+----+ +------------------+----+------------------+----+ Right Kidney  Left Kidney            +------------------+----+------------------+----+ RAR                   RAR                    +------------------+----+------------------+----+ RAR (manual)          RAR (manual)           +------------------+----+------------------+----+ Cortex                Cortex                 +------------------+----+------------------+----+ Cortex thickness      Corex thickness         +------------------+----+------------------+----+ Kidney length (cm)9.50Kidney length (cm)9.25 +------------------+----+------------------+----+  Summary: Renal:  Right: Patent renal artery without evidence of stenosis. Abnormal        right Resistive Index. Left:  Patent renal artery without evidence of stenosis. Abnormal        left Resisitve Index.        Echogenic kidneys bilaterally suggestive of medical renal        disease.  *See table(s) above for measurements and observations.  Diagnosing physician: Ruta Hinds MD  Electronically signed by Ruta Hinds MD on 12/09/2019 at 3:54:38 PM.    Final    MR ORBITS WO CONTRAST  Result Date: 12/08/2019 CLINICAL DATA:  Initial evaluation for acute papilledema. EXAM: MRI ORBITS WITHOUT CONTRAST MRA HEAD WITHOUT CONTRAST MRV HEAD WITHOUT CONTRAST TECHNIQUE: Multiplanar, multiecho pulse sequences of the brain and surrounding structures were obtained without intravenous contrast. Angiographic images of the intracranial venous structures were obtained using MRV technique without intravenous contrast. Angiographic images of the intracranial arterial structures were obtained using MRA technique without intravenous contrast. COMPARISON:  None. FINDINGS: MRI HEAD AND ORBITS WITHOUT CONTRAST Brain: Mildly advanced cerebral atrophy for age. Scattered patchy T2/FLAIR hyperintensity within the periventricular and deep white matter both cerebral hemispheres, nonspecific, but suspected to reflect small vessel ischemic changes, advanced for age. There is diffuse abnormal T2/FLAIR signal abnormality seen throughout the brainstem, with involvement of the midbrain, pons, and medulla. Extension into the left greater than right cerebral peduncles towards the posterior limbs of both internal capsules. Extensive patchy involvement throughout the cerebellum as well, fairly diffuse and symmetric in nature. Associated diffuse swelling and edema throughout the areas affected, with  secondary crowding about the basilar cisterns. Fourth ventricle is partially effaced and slit-like. Slightly increased ventricular dilatation of the lateral and third ventricles as compared to previous head CT from 04/21/2017, suggesting mild hydrocephalus. Thin rim of FLAIR hyperintensity about the lateral ventricles suspected to reflect a degree of transependymal flow of CSF, most pronounced at the left temporal pole (series 15, image 9). No frank herniation of the cerebellar tonsils through the foramen magnum at this time. Associated minimal susceptibility artifact at the right pontine region suggestive of associated minimal petechial hemorrhage (series 18, image 14). No diffusion abnormality seen elsewhere within the brain to suggest acute or subacute ischemia. Gray-white matter differentiation otherwise maintained. No encephalomalacia to suggest chronic cortical infarction. No other foci of susceptibility artifact to suggest acute or chronic intracranial hemorrhage. No mass lesion or midline shift. No extra-axial fluid collection. Pituitary gland and suprasellar region within normal limits. Midline structures intact. Vascular: Major intracranial vascular flow voids are maintained. Skull and upper cervical spine: Mild crowding at the craniocervical junction without frank transtentorial herniation. Visualized upper cervical spine within normal limits.  Bone marrow signal intensity normal. No scalp soft tissue abnormality. Sinuses/Orbits: Globes are symmetric in size. Mild flattening at the posterior globes with subtle bulging of the optic nerve discs and increased CSF along the optic nerve sheaths, suggesting papilledema, and indicating elevated intracranial pressures. Changes most pronounced at the posterior right globe (series 14, image 9). No abnormality about the orbital apices, cavernous sinus, or suprasellar cistern. Optic chiasm normally situated. Superior orbital veins symmetric and within normal limits.  Intraconal and extraconal fat well-maintained. Extra-ocular muscles symmetric and normal. Lacrimal glands normal. Mild scattered mucosal thickening noted within the ethmoidal air cells. Small right sphenoid sinus retention cyst. Paranasal sinuses are otherwise clear. Other: No mastoid effusion.  Inner ear structures grossly normal. MR VENOGRAM WITHOUT CONTRAST Examination somewhat technically limited by patient positioning and rotation. Normal flow related signal seen throughout the anterior and mid superior sagittal sinus. Focal signal loss at the posterior aspect of the superior sagittal sinus could reflect a focal stenosis (series 1031, image 10). No abnormal T1 hyperintensity, FLAIR hyperintensity, or susceptibility artifacts seen at this location to suggest focal thrombus. Superior sagittal sinus otherwise patent distally to the torcula. Torcula itself is patent. Normal flow related signal seen within the transverse and sigmoid sinuses as well as the partially visualized proximal internal jugular veins. Right transverse sinus dominant. Straight sinus appears grossly patent at its origin. Straight sinus, vein of Galen, and internal cerebral veins not well seen distally, felt to be most likely artifactual and technical due to patient positioning. Normal flow voids are seen within the partially visualized straight sinus, vein of Galen, internal cerebral veins, and basal veins of Rosenthal on corresponding T2 weighted sequences. No convincing imaging findings to suggest dural venous thrombosis. MRA HEAD WITHOUT CONTRAST ANTERIOR CIRCULATION: Visualized distal cervical segments of the internal carotid arteries are widely patent with symmetric antegrade flow. Petrous, cavernous, and supraclinoid segments widely patent without stenosis or other abnormality. A1 segments widely patent. Normal anterior communicating artery complex. Anterior cerebral arteries patent to their distal aspects without stenosis. No M1 stenosis  or occlusion. Normal MCA bifurcations. Distal MCA branches well perfused and symmetric. POSTERIOR CIRCULATION: Both vertebral arteries widely patent to the vertebrobasilar junction without stenosis. Neither PICA well visualized. Basilar patent to its distal aspect without stenosis. Superior cerebral arteries patent bilaterally. Both PCA supplied via the basilar as well as small bilateral posterior communicating arteries. Both PCAs well perfused to their distal aspects. IMPRESSION: MRI HEAD IMPRESSION: 1. Diffuse abnormal T2/FLAIR signal abnormality throughout the brainstem and cerebellum, with extension into the left greater than right cerebral peduncles. Findings are nonspecific, with primary differential considerations including changes related to acute hypertensive encephalopathy/atypical PRES or possibly toxic metabolic derangement. Correlation with history and laboratory values recommended. 2. Associated diffuse swelling and edema throughout the areas affected, with secondary crowding about the basilar cisterns, and partial effacement of the fourth ventricle. Slightly increased ventricular dilatation of the lateral and third ventricles suggesting secondary mild hydrocephalus. Thin rim of FLAIR hyperintensity about the lateral ventricles suspected to reflect a degree of transependymal flow of CSF. 3. Associated papilledema at the posterior globes, consistent with elevated ICP. MRA HEAD IMPRESSION: Normal intracranial MRA MRV HEAD IMPRESSION: 1. No convincing findings to suggest dural venous thrombosis. 2. Focal signal loss at the posterior aspect of the superior sagittal sinus, which could reflect a focal stenosis. 3. Non visualization of the straight sinus and majority of the deep venous system, felt to be technical in artifactual nature on this exam due to  patient positioning. Normal flow voids are seen within the partially visualized straight sinus, vein of Galen, and internal cerebral veins on corresponding  brain MRI. Results were discussed by telephone at the time of interpretation on 12/08/2019 at 5:19 am to provider Dr. Gillermina Phy, who verbally acknowledged these results. Electronically Signed   By: Jeannine Boga M.D.   On: 12/08/2019 05:20    Right radiocephalic AV fistula, tunneled hemodialysis catheter.   Subjective: Patient was seen and examined at bedside. Patient was having hemodialysis in the hemodialysis unit. No overnight events. Patient reports feeling much better and wants to be discharged after hemodialysis.  Discharge Exam: Vitals:   12/14/19 1037 12/14/19 1133  BP: (!) 127/2 (!) 127/50  Pulse:  (!) 110  Resp: (!) 22 18  Temp: 99.1 F (37.3 C)   SpO2: 99%    Vitals:   12/14/19 1000 12/14/19 1025 12/14/19 1037 12/14/19 1133  BP: 140/85  (!) 127/2 (!) 127/50  Pulse:    (!) 110  Resp: (!) 9 18 (!) 22 18  Temp:   99.1 F (37.3 C)   TempSrc:   Oral   SpO2:   99%   Weight:   97.6 kg   Height:        General: Pt is alert, awake, not in acute distress Cardiovascular: RRR, S1/S2 +, no rubs, no gallops Respiratory: CTA bilaterally, no wheezing, no rhonchi Abdominal: Soft, NT, ND, bowel sounds + Extremities: Right AV fistula, hemodialysis catheter in the left IJ    The results of significant diagnostics from this hospitalization (including imaging, microbiology, ancillary and laboratory) are listed below for reference.     Microbiology: Recent Results (from the past 240 hour(s))  Resp Panel by RT-PCR (Flu A&B, Covid) Nasopharyngeal Swab     Status: None   Collection Time: 12/07/19  7:45 PM   Specimen: Nasopharyngeal Swab; Nasopharyngeal(NP) swabs in vial transport medium  Result Value Ref Range Status   SARS Coronavirus 2 by RT PCR NEGATIVE NEGATIVE Final    Comment: (NOTE) SARS-CoV-2 target nucleic acids are NOT DETECTED.  The SARS-CoV-2 RNA is generally detectable in upper respiratory specimens during the acute phase of infection. The  lowest concentration of SARS-CoV-2 viral copies this assay can detect is 138 copies/mL. A negative result does not preclude SARS-Cov-2 infection and should not be used as the sole basis for treatment or other patient management decisions. A negative result may occur with  improper specimen collection/handling, submission of specimen other than nasopharyngeal swab, presence of viral mutation(s) within the areas targeted by this assay, and inadequate number of viral copies(<138 copies/mL). A negative result must be combined with clinical observations, patient history, and epidemiological information. The expected result is Negative.  Fact Sheet for Patients:  EntrepreneurPulse.com.au  Fact Sheet for Healthcare Providers:  IncredibleEmployment.be  This test is no t yet approved or cleared by the [place] FDA and  has been authorized for detection and/or diagnosis of SARS-CoV-2 by FDA under an Emergency Use Authorization (EUA). This EUA will remain  in effect (meaning this test can be used) for the duration of the COVID-19 declaration under Section 564(b)(1) of the Act, 21 U.S.C.section 360bbb-3(b)(1), unless the authorization is terminated  or revoked sooner.       Influenza A by PCR NEGATIVE NEGATIVE Final   Influenza B by PCR NEGATIVE NEGATIVE Final    Comment: (NOTE) The Xpert Xpress SARS-CoV-2/FLU/RSV plus assay is intended as an aid in the diagnosis of influenza from Nasopharyngeal swab specimens and  should not be used as a sole basis for treatment. Nasal washings and aspirates are unacceptable for Xpert Xpress SARS-CoV-2/FLU/RSV testing.  Fact Sheet for Patients: EntrepreneurPulse.com.au  Fact Sheet for Healthcare Providers: IncredibleEmployment.be  This test is not yet approved or cleared by the [place] FDA and has been authorized for detection and/or diagnosis of SARS-CoV-2 by FDA under  an Emergency Use Authorization (EUA). This EUA will remain in effect (meaning this test can be used) for the duration of the COVID-19 declaration under Section 564(b)(1) of the Act, 21 U.S.C. section 360bbb-3(b)(1), unless the authorization is terminated or revoked.  Performed at [facility], [place] [address], [place], [place] [address]   MRSA PCR Screening     Status: None   Collection Time: 12/08/19  1:50 AM   Specimen: Nasopharyngeal  Result Value Ref Range Status   MRSA by PCR NEGATIVE NEGATIVE Final    Comment:        The GeneXpert MRSA Assay (FDA approved for NASAL specimens only), is one component of a comprehensive MRSA colonization surveillance program. It is not intended to diagnose MRSA infection nor to guide or monitor treatment for MRSA infections. Performed at [facility], [place] [address], [place], [place] [address]   Surgical PCR screen     Status: None   Collection Time: 12/12/19  2:56 AM   Specimen: Nasal Mucosa; Nasal Swab  Result Value Ref Range Status   MRSA, PCR NEGATIVE NEGATIVE Final   Staphylococcus aureus NEGATIVE NEGATIVE Final    Comment: (NOTE) The Xpert SA Assay (FDA approved for NASAL specimens in patients 32 years of age and older), is one component of a comprehensive surveillance program. It is not intended to diagnose infection nor to guide or monitor treatment. Performed at [facility], [place] [address] [place], [place] [address]      Labs: BNP (last 3 results) No results for input(s): BNP in the last 8760 hours. Basic Metabolic Panel: Recent Labs  Lab 12/10/19 0354 12/10/19 0354 12/10/19 0529 12/11/19 0622 12/12/19 0054 12/13/19 0253 12/14/19 0250  NA 128*   < > 126* 135 136 134* 138  K 3.5   < > 3.6 4.2 4.3 5.4* 4.4  CL 99   < > 96* 100 100 103 98  CO2 19*   < > 20* 20* 21* 19* 26  GLUCOSE 97   < > 106* 106* 122* 104* 91  BUN 61*   < > 59* 61* 63* 67* 49*  CREATININE 11.22*   < > 11.05*  11.19* 10.91* 10.92* 8.15*  CALCIUM 8.8*   < > 8.9 9.3 9.1 9.5 9.4  MG  --   --   --   --   --   --  2.5*  PHOS 5.3*  --   --  7.0* 6.2* 5.3* 6.0*  5.9*   < > = values in this interval not displayed.   Liver Function Tests: Recent Labs  Lab 12/10/19 0354 12/11/19 0622 12/12/19 0054 12/13/19 0253 12/14/19 0250  ALBUMIN 3.2* 3.1* 3.0* 3.4* 3.2*   No results for input(s): LIPASE, AMYLASE in the last 168 hours. No results for input(s): AMMONIA in the last 168 hours. CBC: Recent Labs  Lab 12/07/19 1528 12/07/19 1528 12/08/19 0727 12/08/19 0727 12/09/19 1046 12/10/19 0529 12/12/19 0054 12/13/19 1410 12/14/19 0250  WBC 13.2*   < > 13.8*  --  10.1 10.1 7.7 13.2*  --   NEUTROABS 10.7*  --   --   --   --   --   --   --   --  HGB 14.0   < > 11.7*   < > 11.1* 11.2* 10.2* 10.0* 10.2*  HCT 40.3   < > 33.8*   < > 33.6* 34.3* 31.0* 28.7* 31.3*  MCV 90.2   < > 89.9  --  92.8 94.2 93.9 92.9  --   PLT 171   < > 173  --  244 277 329 328  --    < > = values in this interval not displayed.   Cardiac Enzymes: No results for input(s): CKTOTAL, CKMB, CKMBINDEX, TROPONINI in the last 168 hours. BNP: Invalid input(s): POCBNP CBG: Recent Labs  Lab 12/07/19 1710 12/08/19 0151  GLUCAP 123* 114*   D-Dimer No results for input(s): DDIMER in the last 72 hours. Hgb A1c No results for input(s): HGBA1C in the last 72 hours. Lipid Profile No results for input(s): CHOL, HDL, LDLCALC, TRIG, CHOLHDL, LDLDIRECT in the last 72 hours. Thyroid function studies No results for input(s): TSH, T4TOTAL, T3FREE, THYROIDAB in the last 72 hours.  Invalid input(s): FREET3 Anemia work up No results for input(s): VITAMINB12, FOLATE, FERRITIN, TIBC, IRON, RETICCTPCT in the last 72 hours. Urinalysis    Component Value Date/Time   COLORURINE YELLOW 12/07/2019 1539   APPEARANCEUR CLEAR 12/07/2019 1539   LABSPEC 1.010 12/07/2019 1539   PHURINE 6.0 12/07/2019 1539   GLUCOSEU NEGATIVE 12/07/2019 1539    HGBUR MODERATE (A) 12/07/2019 1539   BILIRUBINUR NEGATIVE 12/07/2019 1539   KETONESUR NEGATIVE 12/07/2019 1539   PROTEINUR >=300 (A) 12/07/2019 1539   NITRITE NEGATIVE 12/07/2019 1539   LEUKOCYTESUR NEGATIVE 12/07/2019 1539   Sepsis Labs Invalid input(s): PROCALCITONIN,  WBC,  LACTICIDVEN Microbiology Recent Results (from the past 240 hour(s))  Resp Panel by RT-PCR (Flu A&B, Covid) Nasopharyngeal Swab     Status: None   Collection Time: 12/07/19  7:45 PM   Specimen: Nasopharyngeal Swab; Nasopharyngeal(NP) swabs in vial transport medium  Result Value Ref Range Status   SARS Coronavirus 2 by RT PCR NEGATIVE NEGATIVE Final    Comment: (NOTE) SARS-CoV-2 target nucleic acids are NOT DETECTED.  The SARS-CoV-2 RNA is generally detectable in upper respiratory specimens during the acute phase of infection. The lowest concentration of SARS-CoV-2 viral copies this assay can detect is 138 copies/mL. A negative result does not preclude SARS-Cov-2 infection and should not be used as the sole basis for treatment or other patient management decisions. A negative result may occur with  improper specimen collection/handling, submission of specimen other than nasopharyngeal swab, presence of viral mutation(s) within the areas targeted by this assay, and inadequate number of viral copies(<138 copies/mL). A negative result must be combined with clinical observations, patient history, and epidemiological information. The expected result is Negative.  Fact Sheet for Patients:  EntrepreneurPulse.com.au  Fact Sheet for Healthcare Providers:  IncredibleEmployment.be  This test is no t yet approved or cleared by the [place] FDA and  has been authorized for detection and/or diagnosis of SARS-CoV-2 by FDA under an Emergency Use Authorization (EUA). This EUA will remain  in effect (meaning this test can be used) for the duration of the COVID-19 declaration under  Section 564(b)(1) of the Act, 21 U.S.C.section 360bbb-3(b)(1), unless the authorization is terminated  or revoked sooner.       Influenza A by PCR NEGATIVE NEGATIVE Final   Influenza B by PCR NEGATIVE NEGATIVE Final    Comment: (NOTE) The Xpert Xpress SARS-CoV-2/FLU/RSV plus assay is intended as an aid in the diagnosis of influenza from Nasopharyngeal swab specimens and should not  be used as a sole basis for treatment. Nasal washings and aspirates are unacceptable for Xpert Xpress SARS-CoV-2/FLU/RSV testing.  Fact Sheet for Patients: EntrepreneurPulse.com.au  Fact Sheet for Healthcare Providers: IncredibleEmployment.be  This test is not yet approved or cleared by the [place] FDA and has been authorized for detection and/or diagnosis of SARS-CoV-2 by FDA under an Emergency Use Authorization (EUA). This EUA will remain in effect (meaning this test can be used) for the duration of the COVID-19 declaration under Section 564(b)(1) of the Act, 21 U.S.C. section 360bbb-3(b)(1), unless the authorization is terminated or revoked.  Performed at [facility], [place] [address] [place], [place] [address]   MRSA PCR Screening     Status: None   Collection Time: 12/08/19  1:50 AM   Specimen: Nasopharyngeal  Result Value Ref Range Status   MRSA by PCR NEGATIVE NEGATIVE Final    Comment:        The GeneXpert MRSA Assay (FDA approved for NASAL specimens only), is one component of a comprehensive MRSA colonization surveillance program. It is not intended to diagnose MRSA infection nor to guide or monitor treatment for MRSA infections. Performed at [facility], [place] [address], [place], [place] [address]   Surgical PCR screen     Status: None   Collection Time: 12/12/19  2:56 AM   Specimen: Nasal Mucosa; Nasal Swab  Result Value Ref Range Status   MRSA, PCR NEGATIVE NEGATIVE Final   Staphylococcus aureus NEGATIVE  NEGATIVE Final    Comment: (NOTE) The Xpert SA Assay (FDA approved for NASAL specimens in patients 80 years of age and older), is one component of a comprehensive surveillance program. It is not intended to diagnose infection nor to guide or monitor treatment. Performed at [facility], [place] [address], [place], [place] [address]      Time coordinating discharge: Over 30 minutes  SIGNED:   Shawna Clamp, MD  [facility] 12/14/2019, 12:19 PM Pager   If 7PM-7AM, please contact night-coverage [email or web address]

## 2019-12-17 LAB — CATECHOLAMINES,UR.,FREE,24 HR
Dopamine, Rand Ur: 45 ug/L
Dopamine, Ur, 24Hr: 95 ug/24 hr (ref 0–510)
Epinephrine, Rand Ur: 2 ug/L
Epinephrine, U, 24Hr: 4 ug/24 hr (ref 0–20)
Norepinephrine, Rand Ur: 11 ug/L
Norepinephrine,U,24H: 23 ug/24 hr (ref 0–135)
Total Volume: 2100

## 2019-12-18 LAB — ALDOSTERONE + RENIN ACTIVITY W/ RATIO
ALDO / PRA Ratio: <0.1 (ref 0.0–30.0)
Aldosterone: <1 ng/dL (ref 0.0–30.0)
PRA LC/MS/MS: 10.187 ng/mL/hr — ABNORMAL HIGH (ref 0.167–5.380)

## 2019-12-26 ENCOUNTER — Other Ambulatory Visit: Payer: Self-pay | Admitting: *Deleted

## 2019-12-26 DIAGNOSIS — N186 End stage renal disease: Secondary | ICD-10-CM

## 2020-01-14 ENCOUNTER — Other Ambulatory Visit: Payer: Self-pay

## 2020-01-14 ENCOUNTER — Ambulatory Visit (HOSPITAL_COMMUNITY)
Admission: RE | Admit: 2020-01-14 | Discharge: 2020-01-14 | Disposition: A | Discharge status: Home / Self Care | Payer: 59 | Source: Ambulatory Visit | Attending: Physician Assistant | Admitting: Physician Assistant

## 2020-01-14 ENCOUNTER — Encounter (HOSPITAL_COMMUNITY): Payer: 59

## 2020-01-14 ENCOUNTER — Ambulatory Visit (INDEPENDENT_AMBULATORY_CARE_PROVIDER_SITE_OTHER): Payer: Self-pay | Admitting: Physician Assistant

## 2020-01-14 VITALS — BP 138/84 | HR 84 | Temp 98.9°F | Resp 16 | Ht 67.5 in | Wt 209.0 lb

## 2020-01-14 DIAGNOSIS — N186 End stage renal disease: Secondary | ICD-10-CM

## 2020-01-14 NOTE — Progress Notes (Signed)
  POST OPERATIVE DIALYSIS ACCESS OFFICE NOTE    CC:  F/u for dialysis access surgery  HPI:  This is a 25 y.o. male who is s/p right radiocephalic AV fistula by Dr. Stanford Breed on December 12, 2019.  Newly, he had a left IJ tunneled dialysis catheter placed.  He denies hand pain or numbness.  Denies fever or chills.  No difficulty with dialysis via catheter.   Dialysis days:  TTS  Dialysis center:  [facility]; [address] [place] [employer]  Allergies  Allergen Reactions  . Other Swelling    Tree nuts    Current Outpatient Medications  Medication Sig Dispense Refill  . allopurinol (ZYLOPRIM) 300 MG tablet Take 300 mg by mouth daily.    Marland Kitchen amLODipine (NORVASC) 10 MG tablet Take 1 tablet (10 mg total) by mouth daily. 30 tablet 1  . hydrALAZINE (APRESOLINE) 100 MG tablet Take 1 tablet (100 mg total) by mouth every 8 (eight) hours. 90 tablet 1  . labetalol (NORMODYNE) 100 MG tablet Take 1 tablet (100 mg total) by mouth 2 (two) times daily. 60 tablet 1   No current facility-administered medications for this visit.     ROS:  See HPI  There were no vitals taken for this visit.   Physical Exam:  General appearance: Well-developed, well-nourished in no apparent distress Cardiac: Heart rate and rhythm are regular Respiratory: Nonlabored Incision: Healing without signs of infection Extremities: Hand is warm with 5/5 grip strength.  Sensation is intact.  Good bruit and thrill in fistula.  Dialysis duplex on 01/14/2020  Patent fistula. Depth ranges from -0.2-0.3 cm distal; prox is 0.61 cm Diameter ranges 0.62 - 0.68 cm   Assessment/Plan:   -pt does not have evidence of steal syndrome -dialysis duplex today reveals fistula to be of adequate diameter and depth for access -the fistula/graft may be used starting March 11, 2020 -please contact us when ready for [facility] removal  Barbie Banner, PA-C 01/14/2020 9:07 AM [facility] [phone]  Clinic MD: Dr. Stanford Breed

## 2022-06-08 IMAGING — MR MR MRA HEAD W/O CM
23 of 26 series · 31 of 48 positions shown · non-contrast
Comparison: None.

CLINICAL DATA: Initial evaluation for acute papilledema.

EXAM:
MRI ORBITS WITHOUT CONTRAST
MRA HEAD WITHOUT CONTRAST
MRV HEAD WITHOUT CONTRAST
TECHNIQUE: Multiplanar, multiecho pulse sequences of the brain and surrounding
structures were obtained without intravenous contrast. Angiographic
images of the intracranial venous structures were obtained using MRV
technique without intravenous contrast. Angiographic images of the
intracranial arterial structures were obtained using MRA technique
without intravenous contrast.

[Series 5: DWI · axial · 3.0mm · 0.88mm/px · z∈[-47,+93]mm · 3 of 96 slices shown (1 of 4)]
[im 1/96]
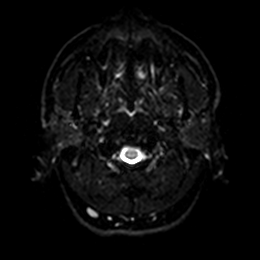
[im 48/96]
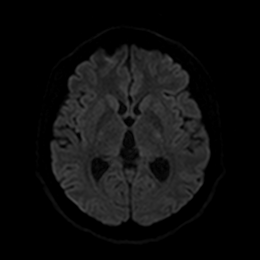
[im 96/96]
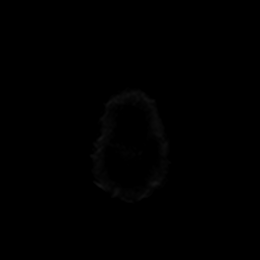

[Series 6: DWI · axial · 3.0mm · 0.88mm/px · z∈[-47,+93]mm · 2 of 48 slices shown (2 of 4)]
[im 1/48]
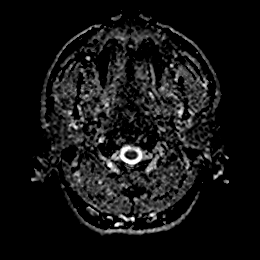
[im 48/48]
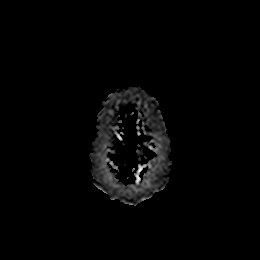

[Series 7: DWI · coronal · 4.0mm · 0.88mm/px · 3 of 66 slices shown (3 of 4)]
[im 1/66]
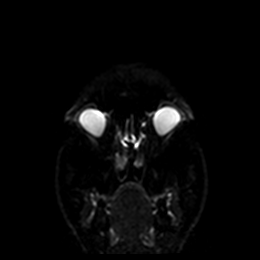
[im 33/66]
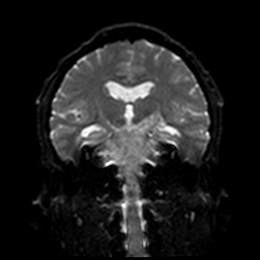
[im 66/66]
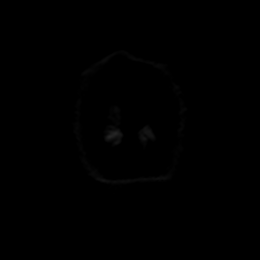

[Series 8: DWI · coronal · 4.0mm · 0.88mm/px · 1 of 33 slices shown (4 of 4)]
[im 1/33]
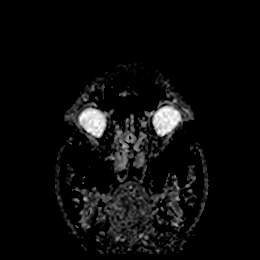

[Series 13: T1 · sagittal · 5.0mm · 0.75mm/px · 1 of 27 slices shown (1 of 3)]
[im 1/27]
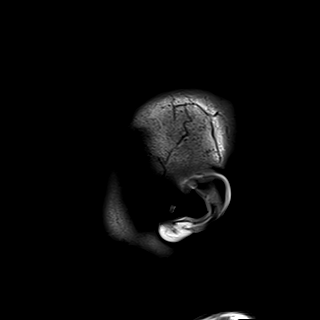

[Series 14: T2 · axial · 5.0mm · 0.72mm/px · 1 of 25 slices shown (1 of 2)]
[im 1/25]
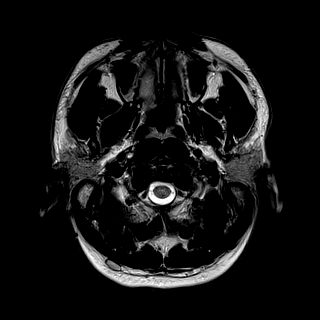

[Series 15: FLAIR · axial · 5.0mm · 0.45mm/px · 1 of 25 slices shown]
[im 1/25]
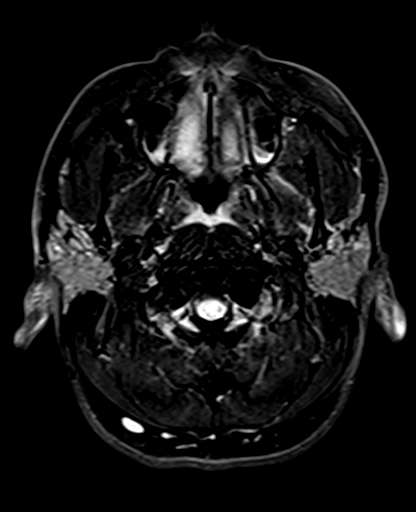

[Series 16: mag_images · axial · 3.0mm · 0.90mm/px · z∈[-54,+99]mm · 2 of 52 slices shown]
[im 1/52]
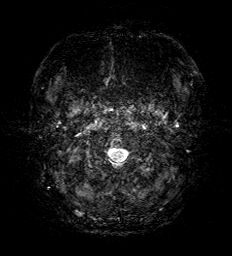
[im 52/52]
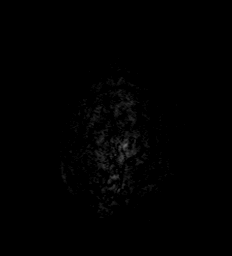

[Series 17: pha_images · axial · 3.0mm · 0.90mm/px · z∈[-54,+99]mm · 2 of 52 slices shown]
[im 1/52]
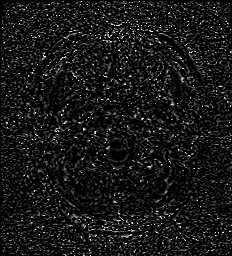
[im 52/52]
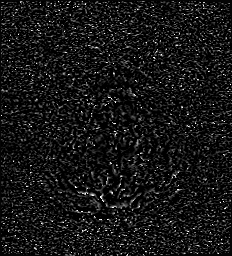

[Series 18: swi_images · axial · 3.0mm · 0.90mm/px · z∈[-54,+99]mm · 2 of 52 slices shown]
[im 1/52]
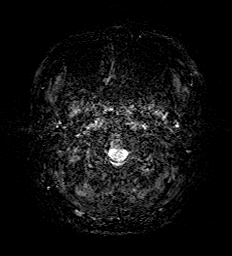
[im 52/52]
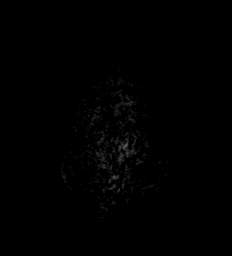

[Series 19: mip_images(sw) · axial · 24.0mm · 0.90mm/px · 1 of 45 slices shown]
[im 1/45]
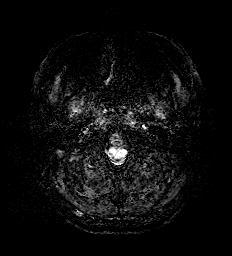

[Series 22: T1 · axial · non-contrast · 3.0mm · 0.37mm/px · 1 of 21 slices shown (2 of 3)]
[im 1/21]
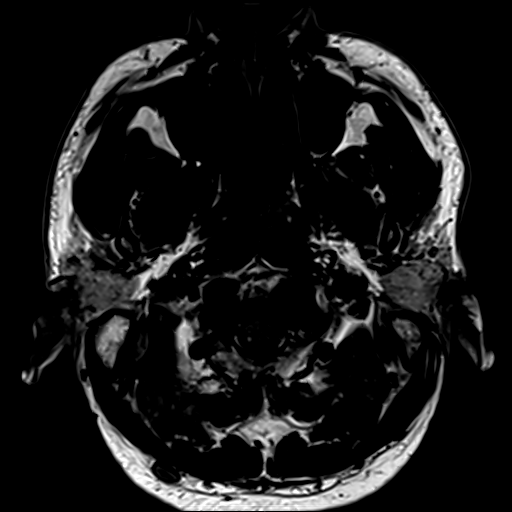

[Series 23: T2 fat-sat · axial · 3.0mm · 0.54mm/px · 1 of 21 slices shown (1 of 9)]
[im 1/21]
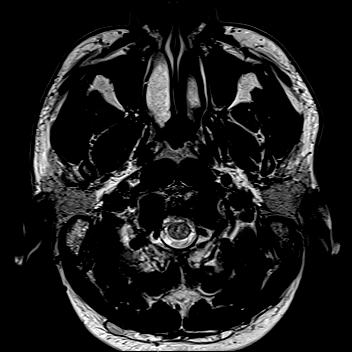

[Series 24: T2 fat-sat · axial · 3.0mm · 0.54mm/px · 1 of 21 slices shown (2 of 9)]
[im 1/21]
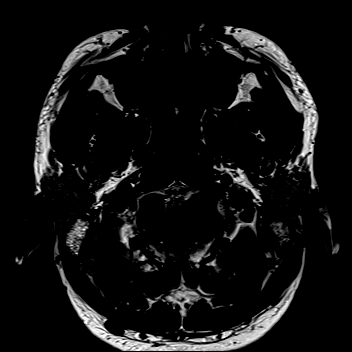

[Series 25: T2 fat-sat · axial · 3.0mm · 0.54mm/px · 1 of 21 slices shown (3 of 9)]
[im 1/21]
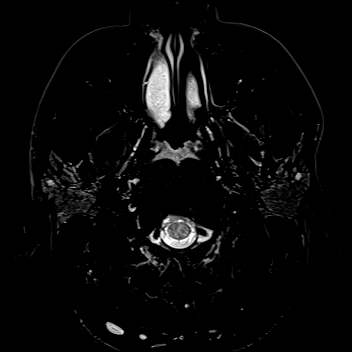

[Series 26: T2 fat-sat · coronal · 3.0mm · 0.54mm/px · 1 of 26 slices shown (4 of 9)]
[im 1/26]
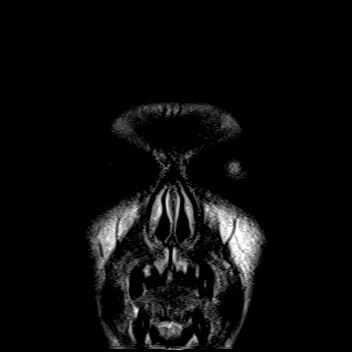

[Series 27: T2 fat-sat · coronal · 3.0mm · 0.54mm/px · 1 of 26 slices shown (5 of 9)]
[im 1/26]
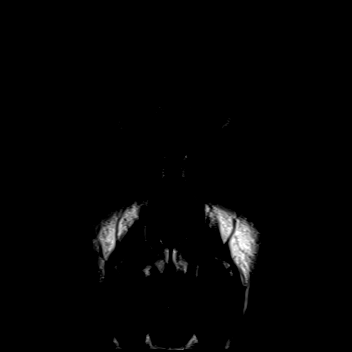

[Series 28: T2 fat-sat · coronal · 3.0mm · 0.54mm/px · 1 of 26 slices shown (6 of 9)]
[im 1/26]
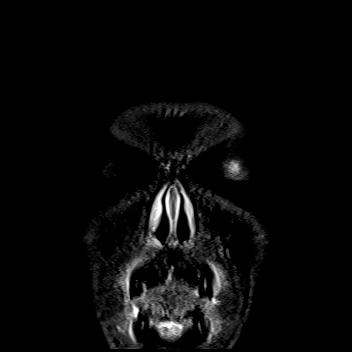

[Series 29: T1 · coronal · 3.0mm · 0.37mm/px · 1 of 26 slices shown (3 of 3)]
[im 1/26]
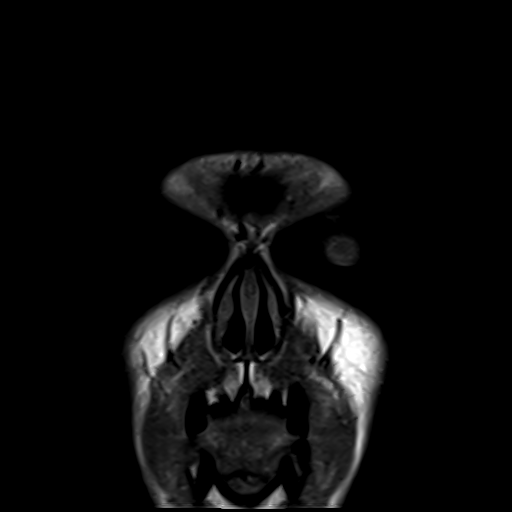

[Series 30: T2 · coronal · 5.0mm · 0.34mm/px · 1 of 29 slices shown (2 of 2)]
[im 1/29]
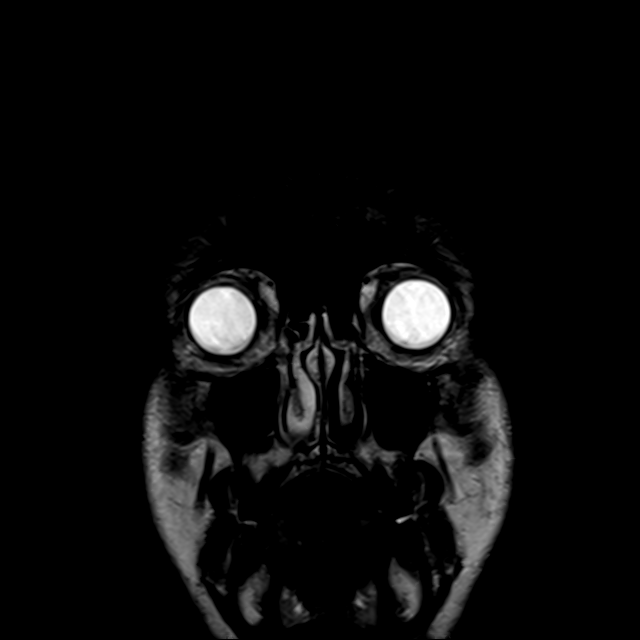

[Series 31: T2 fat-sat · coronal · 3.0mm · 0.54mm/px · 1 of 26 slices shown (7 of 9)]
[im 1/26]
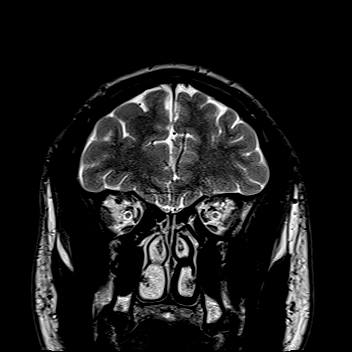

[Series 32: T2 fat-sat · coronal · 3.0mm · 0.54mm/px · 1 of 26 slices shown (8 of 9)]
[im 1/26]
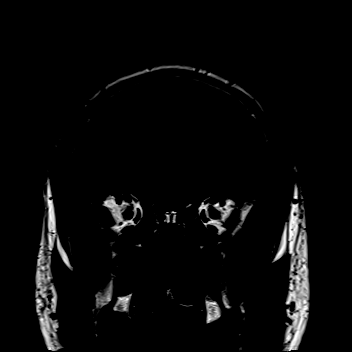

[Series 33: T2 fat-sat · coronal · 3.0mm · 0.54mm/px · 1 of 26 slices shown (9 of 9)]
[im 1/26]
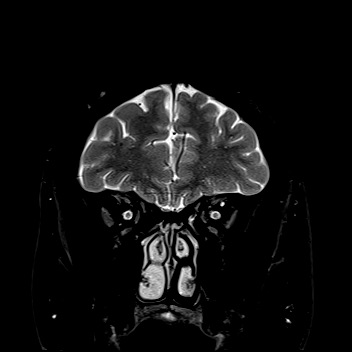

[31 of 48 positions shown; findings below may reference images not displayed]

FINDINGS: MRI HEAD AND ORBITS WITHOUT CONTRAST

Brain: Mildly advanced cerebral atrophy for age. Scattered patchy
T2/FLAIR hyperintensity within the periventricular and deep white
matter both cerebral hemispheres, nonspecific, but suspected to
reflect small vessel ischemic changes, advanced for age.

There is diffuse abnormal T2/FLAIR signal abnormality seen
throughout the brainstem, with involvement of the midbrain, pons,
and medulla. Extension into the left greater than right cerebral
peduncles towards the posterior limbs of both internal capsules.
Extensive patchy involvement throughout the cerebellum as well,
fairly diffuse and symmetric in nature. Associated diffuse swelling
and edema throughout the areas affected, with secondary crowding
about the basilar cisterns. Fourth ventricle is partially effaced
and slit-like. Slightly increased ventricular dilatation of the
lateral and third ventricles as compared to previous head CT from
04/21/2017, suggesting mild hydrocephalus. Thin rim of FLAIR
hyperintensity about the lateral ventricles suspected to reflect a
degree of transependymal flow of CSF, most pronounced at the left
temporal pole (series 15, image 9). No frank herniation of the
cerebellar tonsils through the foramen magnum at this time.
Associated minimal susceptibility artifact at the right pontine
region suggestive of associated minimal petechial hemorrhage (series
18, image 14).

No diffusion abnormality seen elsewhere within the brain to suggest
acute or subacute ischemia. Gray-white matter differentiation
otherwise maintained. No encephalomalacia to suggest chronic
cortical infarction. No other foci of susceptibility artifact to
suggest acute or chronic intracranial hemorrhage.

No mass lesion or midline shift. No extra-axial fluid collection.
Pituitary gland and suprasellar region within normal limits. Midline
structures intact.

Vascular: Major intracranial vascular flow voids are maintained.

Skull and upper cervical spine: Mild crowding at the craniocervical
junction without frank transtentorial herniation. Visualized upper
cervical spine within normal limits. Bone marrow signal intensity
normal. No scalp soft tissue abnormality.

Sinuses/Orbits: Globes are symmetric in size. Mild flattening at the
posterior globes with subtle bulging of the optic nerve discs and
increased CSF along the optic nerve sheaths, suggesting papilledema,
and indicating elevated intracranial pressures. Changes most
pronounced at the posterior right globe (series 14, image 9). No
abnormality about the orbital apices, cavernous sinus, or
suprasellar cistern. Optic chiasm normally situated. Superior
orbital veins symmetric and within normal limits. Intraconal and
extraconal fat well-maintained. Extra-ocular muscles symmetric and
normal. Lacrimal glands normal.

Mild scattered mucosal thickening noted within the ethmoidal air
cells. Small right sphenoid sinus retention cyst. Paranasal sinuses
are otherwise clear.

Other: No mastoid effusion.  Inner ear structures grossly normal.

MR VENOGRAM WITHOUT CONTRAST

Examination somewhat technically limited by patient positioning and
rotation.

Normal flow related signal seen throughout the anterior and mid
superior sagittal sinus. Focal signal loss at the posterior aspect
of the superior sagittal sinus could reflect a focal stenosis
(series 5975, image 10). No abnormal T1 hyperintensity, FLAIR
hyperintensity, or susceptibility artifacts seen at this location to
suggest focal thrombus. Superior sagittal sinus otherwise patent
distally to the torcula. Torcula itself is patent. Normal flow
related signal seen within the transverse and sigmoid sinuses as
well as the partially visualized proximal internal jugular veins.
Right transverse sinus dominant. Straight sinus appears grossly
patent at its origin. Straight sinus, vein of Arlette, and internal
cerebral veins not well seen distally, felt to be most likely
artifactual and technical due to patient positioning. Normal flow
voids are seen within the partially visualized straight sinus, vein
of Arlette, internal cerebral veins, and basal veins of Deivd on
corresponding T2 weighted sequences. No convincing imaging findings
to suggest dural venous thrombosis.

MRA HEAD WITHOUT CONTRAST

ANTERIOR CIRCULATION:

Visualized distal cervical segments of the internal carotid arteries
are widely patent with symmetric antegrade flow. Petrous, cavernous,
and supraclinoid segments widely patent without stenosis or other
abnormality. A1 segments widely patent. Normal anterior
communicating artery complex. Anterior cerebral arteries patent to
their distal aspects without stenosis. No M1 stenosis or occlusion.
Normal MCA bifurcations. Distal MCA branches well perfused and
symmetric.

POSTERIOR CIRCULATION:

Both vertebral arteries widely patent to the vertebrobasilar
junction without stenosis. Neither PICA well visualized. Basilar
patent to its distal aspect without stenosis. Superior cerebral
arteries patent bilaterally. Both PCA supplied via the basilar as
well as small bilateral posterior communicating arteries. Both PCAs
well perfused to their distal aspects.
IMPRESSION: MRI HEAD IMPRESSION:

1. Diffuse abnormal T2/FLAIR signal abnormality throughout the
brainstem and cerebellum, with extension into the left greater than
right cerebral peduncles. Findings are nonspecific, with primary
differential considerations including changes related to acute
hypertensive encephalopathy/atypical PRES or possibly toxic
metabolic derangement. Correlation with history and laboratory
values recommended.
2. Associated diffuse swelling and edema throughout the areas
affected, with secondary crowding about the basilar cisterns, and
partial effacement of the fourth ventricle. Slightly increased
ventricular dilatation of the lateral and third ventricles
suggesting secondary mild hydrocephalus. Thin rim of FLAIR
hyperintensity about the lateral ventricles suspected to reflect a
degree of transependymal flow of CSF.
3. Associated papilledema at the posterior globes, consistent with
elevated ICP.

MRA HEAD IMPRESSION:

Normal intracranial MRA

MRV HEAD IMPRESSION:

1. No convincing findings to suggest dural venous thrombosis.
2. Focal signal loss at the posterior aspect of the superior
sagittal sinus, which could reflect a focal stenosis.
3. Non visualization of the straight sinus and majority of the deep
venous system, felt to be technical in artifactual nature on this
exam due to patient positioning. Normal flow voids are seen within
the partially visualized straight sinus, vein of Arlette, and internal
cerebral veins on corresponding brain MRI.

Results were discussed by telephone at the time of interpretation on
12/08/2019 at [DATE] to provider Dr. Karel, who verbally
acknowledged these results.
